# Patient Record
Sex: Male | Born: 1945
Health system: Southern US, Community
[De-identification: ages and names within clinical notes are randomized; demographics above are authoritative.]

---

## 2018-12-27 ENCOUNTER — Ambulatory Visit (INDEPENDENT_AMBULATORY_CARE_PROVIDER_SITE_OTHER): Payer: Medicare Other | Admitting: Podiatry

## 2018-12-27 ENCOUNTER — Other Ambulatory Visit: Payer: Self-pay

## 2018-12-27 ENCOUNTER — Encounter: Payer: Self-pay | Admitting: Podiatry

## 2018-12-27 DIAGNOSIS — L608 Other nail disorders: Secondary | ICD-10-CM | POA: Diagnosis not present

## 2018-12-27 DIAGNOSIS — E1159 Type 2 diabetes mellitus with other circulatory complications: Secondary | ICD-10-CM | POA: Diagnosis not present

## 2018-12-27 DIAGNOSIS — B351 Tinea unguium: Secondary | ICD-10-CM | POA: Diagnosis not present

## 2018-12-27 DIAGNOSIS — M79675 Pain in left toe(s): Secondary | ICD-10-CM | POA: Diagnosis not present

## 2018-12-27 DIAGNOSIS — M79674 Pain in right toe(s): Secondary | ICD-10-CM

## 2018-12-27 NOTE — Progress Notes (Signed)
This patient presents to the office with chief complaint of long ingrown  thick toe nails both big toes  and diabetic feet.  This patient  says there  is  pain and discomfort his big toes.  .  These nails are painful walking and wearing shoes.  Patient has no history of infection or drainage from both feet.  Patient is unable to  self treat his own nails  Patient has moved from [place] where he had his nails done every six weeks.. This patient presents  to the office today for treatment of his ingrown big toe  nails and a foot evaluation due to history of  diabetes.  General Appearance  Alert, conversant and in no acute stress Vascular  Dorsalis pedis are weakly palpable  B/l.  Posterior tibial pulses are absent  B/l.  Capillary return is diminished   bilaterally. Temperature is within normal limits  bilaterally.  Neurologic  Senn-Weinstein monofilament wire test within normal limits  bilaterally. Muscle power within normal limits bilaterally.  Nails Thick disfigured discolored pincer toenails hallux  B/l.  No redness or swelling or drainage noted.  Orthopedic  No limitations of motion of motion feet .  No crepitus or effusions noted.  No bony pathology or digital deformities noted.  Skin  normotropic skin with no porokeratosis noted bilaterally.  No signs of infections or ulcers noted.     Onychomycosis  Pincer nails   Diabetes with vascular  complications  IE  Debride nails x e.  A diabetic foot exam was performed and there is no evidence  neurologic pathology.  Vascular status should have vascular studies.  Discussed this ingrown nail problem and discussed surgical correction.  RTC 10 weeks.   Gardiner Barefoot DPM

## 2019-01-28 DIAGNOSIS — Z6837 Body mass index (BMI) 37.0-37.9, adult: Secondary | ICD-10-CM | POA: Insufficient documentation

## 2019-01-28 DIAGNOSIS — E1169 Type 2 diabetes mellitus with other specified complication: Secondary | ICD-10-CM | POA: Insufficient documentation

## 2019-01-28 DIAGNOSIS — E785 Hyperlipidemia, unspecified: Secondary | ICD-10-CM | POA: Insufficient documentation

## 2019-01-28 DIAGNOSIS — E66812 Obesity, class 2: Secondary | ICD-10-CM | POA: Insufficient documentation

## 2019-01-28 DIAGNOSIS — K219 Gastro-esophageal reflux disease without esophagitis: Secondary | ICD-10-CM | POA: Insufficient documentation

## 2019-01-28 DIAGNOSIS — I10 Essential (primary) hypertension: Secondary | ICD-10-CM | POA: Insufficient documentation

## 2019-03-14 ENCOUNTER — Other Ambulatory Visit: Payer: Self-pay

## 2019-03-14 ENCOUNTER — Ambulatory Visit: Payer: Medicare Other | Admitting: Podiatry

## 2019-03-14 ENCOUNTER — Encounter: Payer: Self-pay | Admitting: Podiatry

## 2019-03-14 ENCOUNTER — Telehealth: Payer: Self-pay

## 2019-03-14 DIAGNOSIS — E1159 Type 2 diabetes mellitus with other circulatory complications: Secondary | ICD-10-CM

## 2019-03-14 DIAGNOSIS — I739 Peripheral vascular disease, unspecified: Secondary | ICD-10-CM

## 2019-03-14 DIAGNOSIS — M79674 Pain in right toe(s): Secondary | ICD-10-CM

## 2019-03-14 DIAGNOSIS — L608 Other nail disorders: Secondary | ICD-10-CM | POA: Diagnosis not present

## 2019-03-14 DIAGNOSIS — M79675 Pain in left toe(s): Secondary | ICD-10-CM

## 2019-03-14 DIAGNOSIS — B351 Tinea unguium: Secondary | ICD-10-CM | POA: Diagnosis not present

## 2019-03-14 NOTE — Telephone Encounter (Signed)
Referral order has been entered in chart and faxed to [facility] 

## 2019-03-14 NOTE — Progress Notes (Signed)
This patient presents the office with continued concerns with his ingrowing toenails both borders both big toes. .  Patient states that the nails have been doing well except there was one that needed him to trim prior to this visit .  He is still concerned about having surgery for the correction of these ingrowing toenails.  He denies any history of drainage or infection in the toes.  He presents to the office today for an evaluation and treatment of the ingrowing toenails both big toes.   Vascular  Dorsalis pedis  are weakly  palpable  B/L. Posterior tibial pulses are absent both feet.  Capillary return  Diminished..  Temperature gradient is  WNL.  Skin turgor  WNL  Sensorium  Senn Weinstein monofilament wire  WNL. Normal tactile sensation.  Nail Exam  Patient has thick disfigured incurvated nail borders  Hallux nails  B/L. Pincer nails  B/L.  Orthopedic  Exam  Muscle tone and muscle strength  WNL.  No limitations of motion feet  B/L.  No crepitus or joint effusion noted.  Foot type is unremarkable and digits show no abnormalities.  Bony prominences are unremarkable.  Skin  No open lesions.  Normal skin texture and turgor.  Pincer hallux nails.  Incurvation hallux nails  B/L.  Debride nails hallux  B/L.  Discussed this condition with this patient.  We will send him for vascular studies for possible surgical consideration in the future.  Iatrogenic lesion was cauterized on the lateral border left hallux.  RTC 9 weeks.   Gardiner Barefoot DPM

## 2019-03-14 NOTE — Telephone Encounter (Signed)
-----   Message from Gardiner Barefoot, DPM sent at 03/14/2019  8:32 AM EST ----- Could you please set him up for vascular studies.  Thanks  gam

## 2019-03-15 ENCOUNTER — Ambulatory Visit (INDEPENDENT_AMBULATORY_CARE_PROVIDER_SITE_OTHER): Payer: Medicare Other

## 2019-03-15 DIAGNOSIS — I739 Peripheral vascular disease, unspecified: Secondary | ICD-10-CM

## 2019-05-16 ENCOUNTER — Encounter: Payer: Self-pay | Admitting: Podiatry

## 2019-05-16 ENCOUNTER — Ambulatory Visit: Payer: Medicare Other | Admitting: Podiatry

## 2019-05-16 ENCOUNTER — Other Ambulatory Visit: Payer: Self-pay

## 2019-05-16 DIAGNOSIS — M79674 Pain in right toe(s): Secondary | ICD-10-CM

## 2019-05-16 DIAGNOSIS — M79675 Pain in left toe(s): Secondary | ICD-10-CM | POA: Diagnosis not present

## 2019-05-16 DIAGNOSIS — B351 Tinea unguium: Secondary | ICD-10-CM

## 2019-05-16 DIAGNOSIS — E1159 Type 2 diabetes mellitus with other circulatory complications: Secondary | ICD-10-CM

## 2019-05-16 DIAGNOSIS — L608 Other nail disorders: Secondary | ICD-10-CM | POA: Diagnosis not present

## 2019-05-16 NOTE — Progress Notes (Signed)
This patient presents the office with continued concerns with his ingrowing toenails both borders both big toes. .  Patient states he has had his arterial studies performed and there was no evidence of arterial disease.Roy Lynch  He is still concerned about having  possible surgery for the correction of these ingrowing toenails.  He denies any history of drainage or infection in the toes.  He presents to the office today for an evaluation and treatment of the ingrowing toenails both big toes.   Vascular  Dorsalis pedis  are weakly  palpable  B/L. Posterior tibial pulses are absent both feet.  Capillary return  Diminished..  Temperature gradient is  WNL.  Skin turgor  WNL  Sensorium  Senn Weinstein monofilament wire  WNL. Normal tactile sensation.  Nail Exam  Patient has thick disfigured incurvated nail borders  Hallux nails  B/L. Pincer nails  B/L.  Orthopedic  Exam  Muscle tone and muscle strength  WNL.  No limitations of motion feet  B/L.  No crepitus or joint effusion noted.  Foot type is unremarkable and digits show no abnormalities.  Bony prominences are unremarkable.  Skin  No open lesions.  Normal skin texture and turgor.  Pincer hallux nails.  Incurvation hallux nails  B/L.  Debride nails hallux  B/L.  Discussed this condition with this patient.   RTC 9 weeks.   Helane Gunther DPM

## 2019-07-18 ENCOUNTER — Ambulatory Visit (INDEPENDENT_AMBULATORY_CARE_PROVIDER_SITE_OTHER): Payer: Medicare Other | Admitting: Podiatry

## 2019-07-18 ENCOUNTER — Encounter: Payer: Self-pay | Admitting: Podiatry

## 2019-07-18 ENCOUNTER — Other Ambulatory Visit: Payer: Self-pay

## 2019-07-18 VITALS — Temp 97.0°F

## 2019-07-18 DIAGNOSIS — E1159 Type 2 diabetes mellitus with other circulatory complications: Secondary | ICD-10-CM | POA: Diagnosis not present

## 2019-07-18 DIAGNOSIS — B351 Tinea unguium: Secondary | ICD-10-CM

## 2019-07-18 DIAGNOSIS — L608 Other nail disorders: Secondary | ICD-10-CM

## 2019-07-18 DIAGNOSIS — M79675 Pain in left toe(s): Secondary | ICD-10-CM | POA: Diagnosis not present

## 2019-07-18 DIAGNOSIS — M79674 Pain in right toe(s): Secondary | ICD-10-CM

## 2019-07-18 NOTE — Progress Notes (Signed)
This patient returns to my office for at risk foot care.  This patient requires this care by a professional since this patient will be at risk due to having diabetes with vascular disease.  This patient is unable to cut nails himself since the patient cannot reach his nails.These nails are painful walking and wearing shoes.  This patient presents for at risk foot care today.  General Appearance  Alert, conversant and in no acute stress.  Vascular  Dorsalis pedis and posterior tibial  pulses are palpable  bilaterally.  Capillary return is within normal limits  bilaterally. Temperature is within normal limits  bilaterally.  Neurologic  Senn-Weinstein monofilament wire test within normal limits  bilaterally. Muscle power within normal limits bilaterally.  Nails Thick disfigured discolored nails with subungual debris  With incurvation hallux nails  B/L.Marland Kitchen No evidence of bacterial infection or drainage bilaterally.  Orthopedic  No limitations of motion  feet .  No crepitus or effusions noted.  No bony pathology or digital deformities noted.  Skin  normotropic skin with no porokeratosis noted bilaterally.  No signs of infections or ulcers noted.     Onychomycosis  Pain in right toes  Pain in left toes  Consent was obtained for treatment procedures.   Mechanical debridement of hallux nails   bilaterally performed with a nail nipper.  Filed with dremel without incident.    Return office visit   9 weeks                   Told patient to return for periodic foot care and evaluation due to potential at risk complications.   Helane Gunther DPM

## 2019-08-31 DIAGNOSIS — D649 Anemia, unspecified: Secondary | ICD-10-CM | POA: Insufficient documentation

## 2019-08-31 NOTE — Progress Notes (Signed)
[place]  Telephone:([phone] Fax:(336) [phone]  ID: Roy Lynch OB: 07-30-1945  MR#: [id number]  BTD#:[id number]  Patient Care Team: Leonel Ramsay, MD as PCP - General (Infectious Diseases) Lloyd Huger, MD as Consulting Physician (Oncology)  CHIEF COMPLAINT: Anemia, unspecified.  INTERVAL HISTORY: Patient is a 74 year old male who was noted to have decreased hemoglobin on routine blood work.  He has mild weakness and fatigue as well as occasional dyspnea on exertion, but otherwise feels well.  He has no neurologic complaints.  He denies any recent fevers or illnesses.  He has good appetite and denies weight loss.  He has no chest pain, cough, or hemoptysis.  He denies any nausea, vomiting, constipation, or diarrhea.  He denies any melena or hematochezia.  He has no urinary complaints.  Patient otherwise feels well and offers no further specific complaints today.  REVIEW OF SYSTEMS:   Review of Systems  Constitutional: Positive for malaise/fatigue. Negative for fever and weight loss.  Respiratory: Negative.  Negative for cough, hemoptysis and shortness of breath.   Cardiovascular: Negative.  Negative for chest pain and leg swelling.  Gastrointestinal: Negative.  Negative for abdominal pain, blood in stool and melena.  Genitourinary: Negative.  Negative for hematuria.  Musculoskeletal: Negative.  Negative for back pain.  Skin: Negative.  Negative for rash.  Neurological: Positive for weakness. Negative for dizziness, focal weakness and headaches.  Psychiatric/Behavioral: Negative.  The patient is not nervous/anxious.     As per HPI. Otherwise, a complete review of systems is negative.  PAST MEDICAL HISTORY: History reviewed. No pertinent past medical history.  PAST SURGICAL HISTORY: History reviewed. No pertinent surgical history.  FAMILY HISTORY: History reviewed. No pertinent family history.  ADVANCED DIRECTIVES (Y/N):  N  HEALTH  MAINTENANCE: Social History   Tobacco Use  . Smoking status: Never Smoker  . Smokeless tobacco: Never Used  Substance Use Topics  . Alcohol use: Never  . Drug use: Never     Colonoscopy:  PAP:  Bone density:  Lipid panel:  No Known Allergies  Current Outpatient Medications  Medication Sig Dispense Refill  . acetaminophen (TYLENOL) 500 MG tablet Take 500 mg by mouth daily.     Marland Kitchen atorvastatin (LIPITOR) 40 MG tablet Take 40 mg by mouth at bedtime.    . carvedilol (COREG) 6.25 MG tablet Take 6.25 mg by mouth daily.    . chlorhexidine (PERIDEX) 0.12 % solution SMARTSIG:0.5 Capful(s) By Mouth Morning-Evening    . ferrous sulfate 325 (65 FE) MG tablet Take 325 mg by mouth daily with breakfast.     . fluticasone (FLONASE) 50 MCG/ACT nasal spray Place 2 sprays into both nostrils daily.    . furosemide (LASIX) 40 MG tablet Take 40 mg by mouth.    . insulin detemir (LEVEMIR) 100 UNIT/ML injection Inject 25 Units into the skin daily.     . insulin lispro (HUMALOG) 100 UNIT/ML injection Inject 6 Units into the skin 3 (three) times daily with meals.     . liraglutide (VICTOZA) 18 MG/3ML SOPN Inject 18 mg into the skin daily.     Marland Kitchen losartan (COZAAR) 100 MG tablet Take 100 mg by mouth daily.     . metFORMIN (GLUCOPHAGE) 1000 MG tablet Take 1,000 mg by mouth 2 (two) times daily with a meal.     . Multiple Vitamins-Minerals (CENTRUM SILVER) tablet Take 1 tablet by mouth daily.     Marland Kitchen NOVOFINE 32G X 6 MM MISC USE TO INJECT VICTOZA ONCE  DAILY    . pantoprazole (PROTONIX) 40 MG tablet Take 40 mg by mouth daily.    . vitamin B-12 (CYANOCOBALAMIN) 1000 MCG tablet Take 1,000 mcg by mouth daily.     No current facility-administered medications for this visit.    OBJECTIVE: Vitals:   09/03/19 1014  BP: (!) 154/57  Pulse: 65  Resp: 20  Temp: (!) 97.2 F (36.2 C)  SpO2: 98%     There is no height or weight on file to calculate BMI.    ECOG FS:0 - Asymptomatic  General: Well-developed,  well-nourished, no acute distress. Eyes: Pink conjunctiva, anicteric sclera. HEENT: Normocephalic, moist mucous membranes. Lungs: No audible wheezing or coughing. Heart: Regular rate and rhythm. Abdomen: Soft, nontender, no obvious distention. Musculoskeletal: No edema, cyanosis, or clubbing. Neuro: Alert, answering all questions appropriately. Cranial nerves grossly intact. Skin: No rashes or petechiae noted. Psych: Normal affect. Lymphatics: No cervical, calvicular, axillary or inguinal LAD.   LAB RESULTS:  No results found for: NA, K, CL, CO2, GLUCOSE, BUN, CREATININE, CALCIUM, PROT, ALBUMIN, AST, ALT, ALKPHOS, BILITOT, GFRNONAA, GFRAA  Lab Results  Component Value Date   WBC 5.7 09/03/2019   HGB 9.9 (L) 09/03/2019   HCT 31.0 (L) 09/03/2019   MCV 89.3 09/03/2019   PLT 231 09/03/2019     STUDIES: No results found.  ASSESSMENT: Anemia, unspecified.  PLAN:    1. Anemia, unspecified: Patient's hemoglobin remains decreased, but relatively stable at 9.9. His reticulocyte count is inappropriately normal. LDH is also normal, likely indicating no underlying hemolysis. The remainder of his laboratory work including iron stores, B12, folate, hemolysis labs, and Intelligen Myeloid panel, are all pending at time of dictation. Patient has been instructed to continue his oral iron supplementation as prescribed. He will have a video assisted telemedicine visit in 3 weeks to discuss the results as well as any treatment intervention is necessary.  I spent a total of 45 minutes reviewing chart data, face-to-face evaluation with the patient, counseling and coordination of care as detailed above.   Patient expressed understanding and was in agreement with this plan. He also understands that He can call clinic at any time with any questions, concerns, or complaints.   Cancer Staging No matching staging information was found for the patient.  Jeralyn Ruths, MD   09/03/2019 11:52  AM

## 2019-09-03 ENCOUNTER — Inpatient Hospital Stay: Payer: Medicare Other | Attending: Oncology | Admitting: Oncology

## 2019-09-03 ENCOUNTER — Other Ambulatory Visit: Payer: Self-pay

## 2019-09-03 ENCOUNTER — Inpatient Hospital Stay: Payer: Medicare Other

## 2019-09-03 ENCOUNTER — Encounter: Payer: Self-pay | Admitting: Oncology

## 2019-09-03 DIAGNOSIS — R531 Weakness: Secondary | ICD-10-CM | POA: Insufficient documentation

## 2019-09-03 DIAGNOSIS — D649 Anemia, unspecified: Secondary | ICD-10-CM | POA: Diagnosis present

## 2019-09-03 DIAGNOSIS — R0609 Other forms of dyspnea: Secondary | ICD-10-CM

## 2019-09-03 DIAGNOSIS — Z79899 Other long term (current) drug therapy: Secondary | ICD-10-CM | POA: Insufficient documentation

## 2019-09-03 DIAGNOSIS — Z794 Long term (current) use of insulin: Secondary | ICD-10-CM

## 2019-09-03 DIAGNOSIS — R5383 Other fatigue: Secondary | ICD-10-CM | POA: Insufficient documentation

## 2019-09-03 LAB — IRON AND TIBC
Iron: 50 ug/dL (ref 45–182)
Saturation Ratios: 17 % — ABNORMAL LOW (ref 17.9–39.5)
TIBC: 287 ug/dL (ref 250–450)
UIBC: 237 ug/dL

## 2019-09-03 LAB — CBC
HCT: 31 % — ABNORMAL LOW (ref 39.0–52.0)
Hemoglobin: 9.9 g/dL — ABNORMAL LOW (ref 13.0–17.0)
MCH: 28.5 pg (ref 26.0–34.0)
MCHC: 31.9 g/dL (ref 30.0–36.0)
MCV: 89.3 fL (ref 80.0–100.0)
Platelets: 231 10*3/uL (ref 150–400)
RBC: 3.47 MIL/uL — ABNORMAL LOW (ref 4.22–5.81)
RDW: 13.1 % (ref 11.5–15.5)
WBC: 5.7 10*3/uL (ref 4.0–10.5)
nRBC: 0 % (ref 0.0–0.2)

## 2019-09-03 LAB — LACTATE DEHYDROGENASE: LDH: 108 U/L (ref 98–192)

## 2019-09-03 LAB — RETICULOCYTES
Immature Retic Fract: 11.2 % (ref 2.3–15.9)
RBC.: 3.46 MIL/uL — ABNORMAL LOW (ref 4.22–5.81)
Retic Count, Absolute: 61.9 10*3/uL (ref 19.0–186.0)
Retic Ct Pct: 1.8 % (ref 0.4–3.1)

## 2019-09-03 LAB — DAT, POLYSPECIFIC AHG (ARMC ONLY): Polyspecific AHG test: NEGATIVE

## 2019-09-03 LAB — VITAMIN B12: Vitamin B-12: 1954 pg/mL — ABNORMAL HIGH (ref 180–914)

## 2019-09-03 LAB — FOLATE: Folate: 30 ng/mL (ref >5.9)

## 2019-09-03 LAB — FERRITIN: Ferritin: 48 ng/mL (ref 24–336)

## 2019-09-04 LAB — PROTEIN ELECTROPHORESIS, SERUM
A/G Ratio: 1.2 (ref 0.7–1.7)
Albumin ELP: 3.4 g/dL (ref 2.9–4.4)
Alpha-1-Globulin: 0.2 g/dL (ref 0.0–0.4)
Alpha-2-Globulin: 0.6 g/dL (ref 0.4–1.0)
Beta Globulin: 1.1 g/dL (ref 0.7–1.3)
Gamma Globulin: 1 g/dL (ref 0.4–1.8)
Globulin, Total: 2.9 g/dL (ref 2.2–3.9)
Total Protein ELP: 6.3 g/dL (ref 6.0–8.5)

## 2019-09-04 LAB — HAPTOGLOBIN: Haptoglobin: 125 mg/dL (ref 34–355)

## 2019-09-04 LAB — ERYTHROPOIETIN: Erythropoietin: 11 m[IU]/mL (ref 2.6–18.5)

## 2019-09-11 LAB — MISC LABCORP TEST (SEND OUT): Labcorp test code: 451953

## 2019-09-19 ENCOUNTER — Encounter: Payer: Self-pay | Admitting: Podiatry

## 2019-09-19 ENCOUNTER — Ambulatory Visit: Payer: Medicare Other | Admitting: Podiatry

## 2019-09-19 ENCOUNTER — Other Ambulatory Visit: Payer: Self-pay

## 2019-09-19 DIAGNOSIS — I739 Peripheral vascular disease, unspecified: Secondary | ICD-10-CM

## 2019-09-19 DIAGNOSIS — E1159 Type 2 diabetes mellitus with other circulatory complications: Secondary | ICD-10-CM | POA: Diagnosis not present

## 2019-09-19 DIAGNOSIS — M79675 Pain in left toe(s): Secondary | ICD-10-CM

## 2019-09-19 DIAGNOSIS — B351 Tinea unguium: Secondary | ICD-10-CM | POA: Diagnosis not present

## 2019-09-19 DIAGNOSIS — M79674 Pain in right toe(s): Secondary | ICD-10-CM

## 2019-09-19 DIAGNOSIS — L608 Other nail disorders: Secondary | ICD-10-CM | POA: Diagnosis not present

## 2019-09-19 NOTE — Progress Notes (Signed)
This patient returns to my office for at risk foot care.  This patient requires this care by a professional since this patient will be at risk due to having diabetes with vascular disease.  This patient is unable to cut nails himself since the patient cannot reach his nails.These nails are painful walking and wearing shoes.  This patient presents for at risk foot care today.  General Appearance  Alert, conversant and in no acute stress.  Vascular  Dorsalis pedis and posterior tibial  pulses are palpable  bilaterally.  Capillary return is within normal limits  bilaterally. Temperature is within normal limits  bilaterally.  Neurologic  Senn-Weinstein monofilament wire test within normal limits  bilaterally. Muscle power within normal limits bilaterally.  Nails Thick disfigured discolored nails with subungual debris  with incurvation hallux nails  B/L.. No evidence of bacterial infection or drainage bilaterally.  Orthopedic  No limitations of motion  feet .  No crepitus or effusions noted.  No bony pathology or digital deformities noted.  Skin  normotropic skin with no porokeratosis noted bilaterally.  No signs of infections or ulcers noted.     Onychomycosis  Pain in right toes  Pain in left toes  Consent was obtained for treatment procedures.   Mechanical debridement of hallux nails   bilaterally performed with a nail nipper.  Filed with dremel without incident.    Return office visit   9 weeks                   Told patient to return for periodic foot care and evaluation due to potential at risk complications.   Admir Candelas DPM  

## 2019-09-24 NOTE — Progress Notes (Signed)
[facility]  Telephone:(336) [phone] Fax:(336) [phone]  ID: Roy Lynch OB: February 01, 1946  MR#: [id number]  WEX#:[id number]  Patient Care Team: Leonel Ramsay, MD as PCP - General (Infectious Diseases) Lloyd Huger, MD as Consulting Physician (Oncology)  I connected with Roy Lynch on 09/29/19 at  2:30 PM EDT by video enabled telemedicine visit and verified that I am speaking with the correct person using two identifiers.   I discussed the limitations, risks, security and privacy concerns of performing an evaluation and management service by telemedicine and the availability of in-person appointments. I also discussed with the patient that there may be a patient responsible charge related to this service. The patient expressed understanding and agreed to proceed.   Other persons participating in the visit and their role in the encounter: Patient, MD.  Patients location: Home. Providers location: Clinic.  CHIEF COMPLAINT: Anemia, unspecified.  INTERVAL HISTORY: Patient agreed to video assisted telemedicine visit for further evaluation and discussion of his laboratory results.  He currently feels well and is asymptomatic.  He has no neurologic complaints.  He denies any recent fevers or illnesses.  He has a good appetite and denies weight loss.  He has no chest pain, cough, or hemoptysis.  He denies any nausea, vomiting, constipation, or diarrhea.  He denies any melena or hematochezia.  He has no urinary complaints.  Patient offers no specific complaints today.  REVIEW OF SYSTEMS:   Review of Systems  Constitutional: Negative.  Negative for fever, malaise/fatigue and weight loss.  Respiratory: Negative.  Negative for cough, hemoptysis and shortness of breath.   Cardiovascular: Negative.  Negative for chest pain and leg swelling.  Gastrointestinal: Negative.  Negative for abdominal pain, blood in stool and melena.  Genitourinary: Negative.  Negative for  hematuria.  Musculoskeletal: Negative.  Negative for back pain.  Skin: Negative.  Negative for rash.  Neurological: Negative.  Negative for dizziness, focal weakness, weakness and headaches.  Psychiatric/Behavioral: Negative.  The patient is not nervous/anxious.     As per HPI. Otherwise, a complete review of systems is negative.  PAST MEDICAL HISTORY: History reviewed. No pertinent past medical history.  PAST SURGICAL HISTORY: History reviewed. No pertinent surgical history.  FAMILY HISTORY: History reviewed. No pertinent family history.  ADVANCED DIRECTIVES (Y/N):  N  HEALTH MAINTENANCE: Social History   Tobacco Use   Smoking status: Never Smoker   Smokeless tobacco: Never Used  Vaping Use   Vaping Use: Never used  Substance Use Topics   Alcohol use: Never   Drug use: Never     Colonoscopy:  PAP:  Bone density:  Lipid panel:  No Known Allergies  Current Outpatient Medications  Medication Sig Dispense Refill   acetaminophen (TYLENOL) 500 MG tablet Take 500 mg by mouth daily.      atorvastatin (LIPITOR) 20 MG tablet Take 20 mg by mouth daily.     carvedilol (COREG) 6.25 MG tablet Take 6.25 mg by mouth daily.     chlorhexidine (PERIDEX) 0.12 % solution SMARTSIG:0.5 Capful(s) By Mouth Morning-Evening     ferrous sulfate 325 (65 FE) MG tablet Take 325 mg by mouth daily with breakfast.      fluticasone (FLONASE) 50 MCG/ACT nasal spray Place 2 sprays into both nostrils daily.     furosemide (LASIX) 40 MG tablet Take 40 mg by mouth.     insulin detemir (LEVEMIR) 100 UNIT/ML injection Inject 25 Units into the skin daily.      insulin lispro (HUMALOG) 100 UNIT/ML  injection Inject 6 Units into the skin 3 (three) times daily with meals.      liraglutide (VICTOZA) 18 MG/3ML SOPN Inject 18 mg into the skin daily.      losartan (COZAAR) 100 MG tablet Take 100 mg by mouth daily.      metFORMIN (GLUCOPHAGE) 1000 MG tablet Take 1,000 mg by mouth 2 (two) times  daily with a meal.      Multiple Vitamins-Minerals (CENTRUM SILVER) tablet Take 1 tablet by mouth daily.      NOVOFINE 32G X 6 MM MISC USE TO INJECT VICTOZA ONCE DAILY     pantoprazole (PROTONIX) 40 MG tablet Take 40 mg by mouth daily.     vitamin B-12 (CYANOCOBALAMIN) 1000 MCG tablet Take 1,000 mcg by mouth daily.     No current facility-administered medications for this visit.    OBJECTIVE: There were no vitals filed for this visit.   There is no height or weight on file to calculate BMI.    ECOG FS:0 - Asymptomatic  General: Well-developed, well-nourished, no acute distress. HEENT: Normocephalic. Neuro: Alert, answering all questions appropriately. Cranial nerves grossly intact. Psych: Normal affect.   LAB RESULTS:  No results found for: NA, K, CL, CO2, GLUCOSE, BUN, CREATININE, CALCIUM, PROT, ALBUMIN, AST, ALT, ALKPHOS, BILITOT, GFRNONAA, GFRAA  Lab Results  Component Value Date   WBC 5.7 09/03/2019   HGB 9.9 (L) 09/03/2019   HCT 31.0 (L) 09/03/2019   MCV 89.3 09/03/2019   PLT 231 09/03/2019     STUDIES: No results found.  ASSESSMENT: Anemia, unspecified.  PLAN:    1. Anemia, unspecified: Patient's hemoglobin remains decreased, but relatively stable at 9.9.  He has a mildly decreased saturation ratio of 17%, but the remainder of his laboratory work is either negative or within normal limits including SPEP and Intelligen Myeloid panel. Patient has been instructed to continue his oral iron supplementation as prescribed.  No intervention is needed at this time.  Patient does not require bone marrow biopsy, but would consider 1 in the future if his hemoglobin trends down.  Return to clinic in 6 months with repeat laboratory work and video assisted telemedicine visit.  I provided 20 minutes of face-to-face video visit time during this encounter which included chart review, counseling, and coordination of care as documented above.   Patient expressed understanding and  was in agreement with this plan. He also understands that He can call clinic at any time with any questions, concerns, or complaints.    Lloyd Huger, MD   09/29/2019 8:46 AM

## 2019-09-27 ENCOUNTER — Inpatient Hospital Stay: Payer: Medicare Other | Attending: Oncology | Admitting: Oncology

## 2019-09-27 ENCOUNTER — Encounter: Payer: Self-pay | Admitting: Oncology

## 2019-09-27 DIAGNOSIS — D649 Anemia, unspecified: Secondary | ICD-10-CM | POA: Diagnosis not present

## 2019-09-27 NOTE — Progress Notes (Signed)
Patient prescreened for appointment. Patient reports that he hasn't been having a lot of energy lately

## 2019-11-21 ENCOUNTER — Other Ambulatory Visit: Payer: Self-pay

## 2019-11-21 ENCOUNTER — Ambulatory Visit: Payer: Medicare Other | Admitting: Podiatry

## 2019-11-21 ENCOUNTER — Encounter: Payer: Self-pay | Admitting: Podiatry

## 2019-11-21 DIAGNOSIS — L608 Other nail disorders: Secondary | ICD-10-CM

## 2019-11-21 DIAGNOSIS — E1159 Type 2 diabetes mellitus with other circulatory complications: Secondary | ICD-10-CM | POA: Diagnosis not present

## 2019-11-21 DIAGNOSIS — B351 Tinea unguium: Secondary | ICD-10-CM

## 2019-11-21 DIAGNOSIS — M79675 Pain in left toe(s): Secondary | ICD-10-CM

## 2019-11-21 DIAGNOSIS — M79674 Pain in right toe(s): Secondary | ICD-10-CM

## 2019-11-21 NOTE — Progress Notes (Signed)
This patient returns to my office for at risk foot care.  This patient requires this care by a professional since this patient will be at risk due to having diabetes with vascular disease.  This patient is unable to cut nails himself since the patient cannot reach his nails.These nails are painful walking and wearing shoes.  This patient presents for at risk foot care today.  General Appearance  Alert, conversant and in no acute stress.  Vascular  Dorsalis pedis and posterior tibial  pulses are palpable  bilaterally.  Capillary return is within normal limits  bilaterally. Temperature is within normal limits  bilaterally.  Neurologic  Senn-Weinstein monofilament wire test within normal limits  bilaterally. Muscle power within normal limits bilaterally.  Nails Thick disfigured discolored nails with subungual debris  with incurvation hallux nails  B/L.. No evidence of bacterial infection or drainage bilaterally.  Orthopedic  No limitations of motion  feet .  No crepitus or effusions noted.  No bony pathology or digital deformities noted.  Skin  normotropic skin with no porokeratosis noted bilaterally.  No signs of infections or ulcers noted.     Onychomycosis  Pain in right toes  Pain in left toes  Consent was obtained for treatment procedures.   Mechanical debridement of hallux nails   bilaterally performed with a nail nipper.  Filed with dremel without incident.    Return office visit   9 weeks                   Told patient to return for periodic foot care and evaluation due to potential at risk complications.   Olden Klauer DPM  

## 2020-01-27 ENCOUNTER — Ambulatory Visit: Payer: Medicare Other | Admitting: Podiatry

## 2020-01-27 ENCOUNTER — Encounter: Payer: Self-pay | Admitting: Podiatry

## 2020-01-27 ENCOUNTER — Other Ambulatory Visit: Payer: Self-pay

## 2020-01-27 DIAGNOSIS — E1159 Type 2 diabetes mellitus with other circulatory complications: Secondary | ICD-10-CM

## 2020-01-27 DIAGNOSIS — B351 Tinea unguium: Secondary | ICD-10-CM

## 2020-01-27 DIAGNOSIS — I739 Peripheral vascular disease, unspecified: Secondary | ICD-10-CM

## 2020-01-27 DIAGNOSIS — M79675 Pain in left toe(s): Secondary | ICD-10-CM

## 2020-01-27 DIAGNOSIS — M79674 Pain in right toe(s): Secondary | ICD-10-CM

## 2020-01-27 DIAGNOSIS — L608 Other nail disorders: Secondary | ICD-10-CM

## 2020-01-27 NOTE — Progress Notes (Signed)
This patient returns to my office for at risk foot care.  This patient requires this care by a professional since this patient will be at risk due to having diabetes with vascular disease.  This patient is unable to cut nails himself since the patient cannot reach his nails.These nails are painful walking and wearing shoes.  This patient presents for at risk foot care today.  General Appearance  Alert, conversant and in no acute stress.  Vascular  Dorsalis pedis and posterior tibial  pulses are palpable  bilaterally.  Capillary return is within normal limits  bilaterally. Temperature is within normal limits  bilaterally.  Neurologic  Senn-Weinstein monofilament wire test within normal limits  bilaterally. Muscle power within normal limits bilaterally.  Nails Thick disfigured discolored nails with subungual debris  with incurvation hallux nails  B/L.. No evidence of bacterial infection or drainage bilaterally.  Orthopedic  No limitations of motion  feet .  No crepitus or effusions noted.  No bony pathology or digital deformities noted.  Skin  normotropic skin with no porokeratosis noted bilaterally.  No signs of infections or ulcers noted.     Onychomycosis  Pain in right toes  Pain in left toes  Consent was obtained for treatment procedures.   Mechanical debridement of hallux nails   bilaterally performed with a nail nipper.  Filed with dremel without incident.    Return office visit   9 weeks                   Told patient to return for periodic foot care and evaluation due to potential at risk complications.   Gery Sabedra DPM  

## 2020-03-30 ENCOUNTER — Encounter: Payer: Self-pay | Admitting: Podiatry

## 2020-03-30 ENCOUNTER — Other Ambulatory Visit: Payer: Self-pay

## 2020-03-30 ENCOUNTER — Inpatient Hospital Stay: Payer: Medicare Other | Attending: Oncology

## 2020-03-30 ENCOUNTER — Other Ambulatory Visit: Payer: Self-pay | Admitting: Oncology

## 2020-03-30 ENCOUNTER — Ambulatory Visit: Payer: Medicare Other | Admitting: Podiatry

## 2020-03-30 DIAGNOSIS — D649 Anemia, unspecified: Secondary | ICD-10-CM | POA: Diagnosis not present

## 2020-03-30 DIAGNOSIS — B351 Tinea unguium: Secondary | ICD-10-CM | POA: Diagnosis not present

## 2020-03-30 DIAGNOSIS — L608 Other nail disorders: Secondary | ICD-10-CM | POA: Diagnosis not present

## 2020-03-30 DIAGNOSIS — I739 Peripheral vascular disease, unspecified: Secondary | ICD-10-CM | POA: Diagnosis not present

## 2020-03-30 DIAGNOSIS — M79675 Pain in left toe(s): Secondary | ICD-10-CM

## 2020-03-30 DIAGNOSIS — E1159 Type 2 diabetes mellitus with other circulatory complications: Secondary | ICD-10-CM | POA: Diagnosis not present

## 2020-03-30 DIAGNOSIS — M79674 Pain in right toe(s): Secondary | ICD-10-CM

## 2020-03-30 LAB — COMPREHENSIVE METABOLIC PANEL
ALT: 14 U/L (ref 0–44)
AST: 15 U/L (ref 15–41)
Albumin: 3.9 g/dL (ref 3.5–5.0)
Alkaline Phosphatase: 58 U/L (ref 38–126)
Anion gap: 12 (ref 5–15)
BUN: 32 mg/dL — ABNORMAL HIGH (ref 8–23)
CO2: 24 mmol/L (ref 22–32)
Calcium: 9.7 mg/dL (ref 8.9–10.3)
Chloride: 104 mmol/L (ref 98–111)
Creatinine, Ser: 1.51 mg/dL — ABNORMAL HIGH (ref 0.61–1.24)
GFR, Estimated: 48 mL/min — ABNORMAL LOW (ref >60)
Glucose, Bld: 81 mg/dL (ref 70–99)
Potassium: 4.4 mmol/L (ref 3.5–5.1)
Sodium: 140 mmol/L (ref 135–145)
Total Bilirubin: 0.8 mg/dL (ref 0.3–1.2)
Total Protein: 7.5 g/dL (ref 6.5–8.1)

## 2020-03-30 LAB — CBC WITH DIFFERENTIAL/PLATELET
Abs Immature Granulocytes: 0.02 10*3/uL (ref 0.00–0.07)
Basophils Absolute: 0.1 10*3/uL (ref 0.0–0.1)
Basophils Relative: 1 %
Eosinophils Absolute: 0.2 10*3/uL (ref 0.0–0.5)
Eosinophils Relative: 3 %
HCT: 31.7 % — ABNORMAL LOW (ref 39.0–52.0)
Hemoglobin: 10.3 g/dL — ABNORMAL LOW (ref 13.0–17.0)
Immature Granulocytes: 0 %
Lymphocytes Relative: 43 %
Lymphs Abs: 3.3 10*3/uL (ref 0.7–4.0)
MCH: 29.3 pg (ref 26.0–34.0)
MCHC: 32.5 g/dL (ref 30.0–36.0)
MCV: 90.1 fL (ref 80.0–100.0)
Monocytes Absolute: 0.7 10*3/uL (ref 0.1–1.0)
Monocytes Relative: 9 %
Neutro Abs: 3.4 10*3/uL (ref 1.7–7.7)
Neutrophils Relative %: 44 %
Platelets: 267 10*3/uL (ref 150–400)
RBC: 3.52 MIL/uL — ABNORMAL LOW (ref 4.22–5.81)
RDW: 12.8 % (ref 11.5–15.5)
WBC: 7.6 10*3/uL (ref 4.0–10.5)
nRBC: 0 % (ref 0.0–0.2)

## 2020-03-30 LAB — FERRITIN: Ferritin: 63 ng/mL (ref 24–336)

## 2020-03-30 NOTE — Progress Notes (Signed)
This patient returns to my office for at risk foot care.  This patient requires this care by a professional since this patient will be at risk due to having diabetes with vascular disease.  This patient is unable to cut nails himself since the patient cannot reach his nails.These nails are painful walking and wearing shoes.  This patient presents for at risk foot care today.  General Appearance  Alert, conversant and in no acute stress.  Vascular  Dorsalis pedis and posterior tibial  pulses are palpable  bilaterally.  Capillary return is within normal limits  bilaterally. Temperature is within normal limits  bilaterally.  Neurologic  Senn-Weinstein monofilament wire test within normal limits  bilaterally. Muscle power within normal limits bilaterally.  Nails Thick disfigured discolored nails with subungual debris  with incurvation hallux nails  B/L.. No evidence of bacterial infection or drainage bilaterally.  Orthopedic  No limitations of motion  feet .  No crepitus or effusions noted.  No bony pathology or digital deformities noted.  Skin  normotropic skin with no porokeratosis noted bilaterally.  No signs of infections or ulcers noted.     Onychomycosis  Pain in right toes  Pain in left toes  Consent was obtained for treatment procedures.   Mechanical debridement of hallux nails   bilaterally performed with a nail nipper.  Filed with dremel without incident.    Return office visit   9 weeks                   Told patient to return for periodic foot care and evaluation due to potential at risk complications.   Tiasia Weberg DPM  

## 2020-04-05 NOTE — Progress Notes (Signed)
  [facility]  Telephone:(336) [phone] Fax:(336) [phone]  ID: Roy Lynch OB: [birth year]  MR#: [id number]  IRS#:[id number]  Patient Care Team: Mick Sell, MD as PCP - General (Infectious Diseases) Jeralyn Ruths, MD as Consulting Physician (Oncology)    Jeralyn Ruths, MD   04/07/2020 8:44 AM     This encounter was created in error - please disregard.

## 2020-04-06 ENCOUNTER — Inpatient Hospital Stay: Payer: Medicare Other | Admitting: Oncology

## 2020-04-06 ENCOUNTER — Encounter: Payer: Self-pay | Admitting: Oncology

## 2020-04-06 NOTE — Progress Notes (Signed)
Patient denies any concerns today.  

## 2020-06-01 ENCOUNTER — Encounter: Payer: Self-pay | Admitting: Podiatry

## 2020-06-01 ENCOUNTER — Other Ambulatory Visit: Payer: Self-pay

## 2020-06-01 ENCOUNTER — Ambulatory Visit: Payer: Medicare Other | Admitting: Podiatry

## 2020-06-01 DIAGNOSIS — L608 Other nail disorders: Secondary | ICD-10-CM | POA: Diagnosis not present

## 2020-06-01 DIAGNOSIS — E1159 Type 2 diabetes mellitus with other circulatory complications: Secondary | ICD-10-CM

## 2020-06-01 DIAGNOSIS — B351 Tinea unguium: Secondary | ICD-10-CM | POA: Diagnosis not present

## 2020-06-01 DIAGNOSIS — M79674 Pain in right toe(s): Secondary | ICD-10-CM

## 2020-06-01 DIAGNOSIS — M79675 Pain in left toe(s): Secondary | ICD-10-CM

## 2020-06-01 NOTE — Progress Notes (Signed)
This patient returns to my office for at risk foot care.  This patient requires this care by a professional since this patient will be at risk due to having diabetes with vascular disease.  This patient is unable to cut nails himself since the patient cannot reach his nails.These nails are painful walking and wearing shoes.  This patient presents for at risk foot care today.  General Appearance  Alert, conversant and in no acute stress.  Vascular  Dorsalis pedis and posterior tibial  pulses are palpable  bilaterally.  Capillary return is within normal limits  bilaterally. Temperature is within normal limits  bilaterally.  Neurologic  Senn-Weinstein monofilament wire test within normal limits  bilaterally. Muscle power within normal limits bilaterally.  Nails Thick disfigured discolored nails with subungual debris  with incurvation hallux nails  B/L.Marland Kitchen No evidence of bacterial infection or drainage bilaterally.  Orthopedic  No limitations of motion  feet .  No crepitus or effusions noted.  No bony pathology or digital deformities noted.  Skin  normotropic skin with no porokeratosis noted bilaterally.  No signs of infections or ulcers noted.     Onychomycosis  Pain in right toes  Pain in left toes  Consent was obtained for treatment procedures.   Mechanical debridement of hallux nails   bilaterally performed with a nail nipper.  Filed with dremel without incident.    Return office visit   9 weeks                   Told patient to return for periodic foot care and evaluation due to potential at risk complications.   Helane Gunther DPM

## 2020-08-03 ENCOUNTER — Ambulatory Visit: Payer: Medicare Other | Admitting: Podiatry

## 2020-08-03 ENCOUNTER — Encounter: Payer: Self-pay | Admitting: Podiatry

## 2020-08-03 ENCOUNTER — Other Ambulatory Visit: Payer: Self-pay

## 2020-08-03 DIAGNOSIS — M79674 Pain in right toe(s): Secondary | ICD-10-CM

## 2020-08-03 DIAGNOSIS — L608 Other nail disorders: Secondary | ICD-10-CM

## 2020-08-03 DIAGNOSIS — B351 Tinea unguium: Secondary | ICD-10-CM | POA: Diagnosis not present

## 2020-08-03 DIAGNOSIS — M79675 Pain in left toe(s): Secondary | ICD-10-CM

## 2020-08-03 DIAGNOSIS — E1159 Type 2 diabetes mellitus with other circulatory complications: Secondary | ICD-10-CM

## 2020-08-03 NOTE — Progress Notes (Signed)
This patient returns to my office for at risk foot care.  This patient requires this care by a professional since this patient will be at risk due to having diabetes with vascular disease.  This patient is unable to cut nails himself since the patient cannot reach his nails.These nails are painful walking and wearing shoes.  This patient presents for at risk foot care today.  General Appearance  Alert, conversant and in no acute stress.  Vascular  Dorsalis pedis and posterior tibial  pulses are palpable  bilaterally.  Capillary return is within normal limits  bilaterally. Temperature is within normal limits  bilaterally.  Neurologic  Senn-Weinstein monofilament wire test within normal limits  bilaterally. Muscle power within normal limits bilaterally.  Nails Thick disfigured discolored nails with subungual debris  with incurvation hallux nails  B/L.Marland Kitchen No evidence of bacterial infection or drainage bilaterally.  Orthopedic  No limitations of motion  feet .  No crepitus or effusions noted.  No bony pathology or digital deformities noted.  Skin  normotropic skin with no porokeratosis noted bilaterally.  No signs of infections or ulcers noted.     Onychomycosis  Pain in right toes  Pain in left toes  Consent was obtained for treatment procedures.   Mechanical debridement of hallux nails   bilaterally performed with a nail nipper.  Filed with dremel without incident.    Return office visit   9 weeks                   Told patient to return for periodic foot care and evaluation due to potential at risk complications.   Helane Gunther DPM

## 2020-09-17 ENCOUNTER — Other Ambulatory Visit: Payer: Self-pay | Admitting: Nephrology

## 2020-09-17 DIAGNOSIS — N1831 Chronic kidney disease, stage 3a: Secondary | ICD-10-CM

## 2020-09-17 DIAGNOSIS — E1122 Type 2 diabetes mellitus with diabetic chronic kidney disease: Secondary | ICD-10-CM

## 2020-10-19 ENCOUNTER — Ambulatory Visit
Admission: RE | Admit: 2020-10-19 | Discharge: 2020-10-19 | Disposition: A | Discharge status: Home / Self Care | Payer: Medicare Other | Source: Ambulatory Visit | Attending: Nephrology | Admitting: Nephrology

## 2020-10-19 ENCOUNTER — Other Ambulatory Visit: Payer: Self-pay

## 2020-10-19 DIAGNOSIS — E1122 Type 2 diabetes mellitus with diabetic chronic kidney disease: Secondary | ICD-10-CM | POA: Diagnosis not present

## 2020-10-19 DIAGNOSIS — N1831 Chronic kidney disease, stage 3a: Secondary | ICD-10-CM | POA: Insufficient documentation

## 2020-11-05 ENCOUNTER — Encounter: Payer: Self-pay | Admitting: Podiatry

## 2020-11-05 ENCOUNTER — Other Ambulatory Visit: Payer: Self-pay

## 2020-11-05 ENCOUNTER — Ambulatory Visit: Payer: Medicare Other | Admitting: Podiatry

## 2020-11-05 DIAGNOSIS — L608 Other nail disorders: Secondary | ICD-10-CM | POA: Diagnosis not present

## 2020-11-05 DIAGNOSIS — E1159 Type 2 diabetes mellitus with other circulatory complications: Secondary | ICD-10-CM

## 2020-11-05 DIAGNOSIS — M79674 Pain in right toe(s): Secondary | ICD-10-CM

## 2020-11-05 DIAGNOSIS — B351 Tinea unguium: Secondary | ICD-10-CM

## 2020-11-05 DIAGNOSIS — I739 Peripheral vascular disease, unspecified: Secondary | ICD-10-CM | POA: Diagnosis not present

## 2020-11-05 DIAGNOSIS — M79675 Pain in left toe(s): Secondary | ICD-10-CM

## 2020-11-05 NOTE — Progress Notes (Signed)
This patient returns to my office for at risk foot care.  This patient requires this care by a professional since this patient will be at risk due to having diabetes with vascular disease.  This patient is unable to cut nails himself since the patient cannot reach his nails.These nails are painful walking and wearing shoes.  This patient presents for at risk foot care today.  General Appearance  Alert, conversant and in no acute stress.  Vascular  Dorsalis pedis and posterior tibial  pulses are palpable  bilaterally.  Capillary return is within normal limits  bilaterally. Temperature is within normal limits  bilaterally.  Neurologic  Senn-Weinstein monofilament wire test within normal limits  bilaterally. Muscle power within normal limits bilaterally.  Nails Thick disfigured discolored nails with subungual debris  with incurvation hallux nails  B/L.Marland Kitchen No evidence of bacterial infection or drainage bilaterally.  Orthopedic  No limitations of motion  feet .  No crepitus or effusions noted.  No bony pathology or digital deformities noted.  Skin  normotropic skin with no porokeratosis noted bilaterally.  No signs of infections or ulcers noted.     Onychomycosis  Pain in right toes  Pain in left toes  Consent was obtained for treatment procedures.   Mechanical debridement of hallux nails   bilaterally performed with a nail nipper.  Filed with dremel without incident.    Return office visit   9 weeks                   Told patient to return for periodic foot care and evaluation due to potential at risk complications.   Helane Gunther DPM

## 2021-01-07 ENCOUNTER — Ambulatory Visit: Payer: Medicare Other | Admitting: Podiatry

## 2021-01-19 DIAGNOSIS — E785 Hyperlipidemia, unspecified: Secondary | ICD-10-CM | POA: Diagnosis not present

## 2021-01-19 DIAGNOSIS — N182 Chronic kidney disease, stage 2 (mild): Secondary | ICD-10-CM | POA: Diagnosis not present

## 2021-01-19 DIAGNOSIS — D649 Anemia, unspecified: Secondary | ICD-10-CM | POA: Diagnosis not present

## 2021-01-19 DIAGNOSIS — Z794 Long term (current) use of insulin: Secondary | ICD-10-CM | POA: Diagnosis not present

## 2021-01-19 DIAGNOSIS — E1169 Type 2 diabetes mellitus with other specified complication: Secondary | ICD-10-CM | POA: Diagnosis not present

## 2021-01-19 DIAGNOSIS — I10 Essential (primary) hypertension: Secondary | ICD-10-CM | POA: Diagnosis not present

## 2021-01-19 DIAGNOSIS — Z6837 Body mass index (BMI) 37.0-37.9, adult: Secondary | ICD-10-CM | POA: Diagnosis not present

## 2021-01-26 DIAGNOSIS — Z794 Long term (current) use of insulin: Secondary | ICD-10-CM | POA: Diagnosis not present

## 2021-01-26 DIAGNOSIS — I129 Hypertensive chronic kidney disease with stage 1 through stage 4 chronic kidney disease, or unspecified chronic kidney disease: Secondary | ICD-10-CM | POA: Diagnosis not present

## 2021-01-26 DIAGNOSIS — N189 Chronic kidney disease, unspecified: Secondary | ICD-10-CM | POA: Diagnosis not present

## 2021-01-26 DIAGNOSIS — D649 Anemia, unspecified: Secondary | ICD-10-CM | POA: Diagnosis not present

## 2021-01-26 DIAGNOSIS — E785 Hyperlipidemia, unspecified: Secondary | ICD-10-CM | POA: Diagnosis not present

## 2021-01-26 DIAGNOSIS — Z6837 Body mass index (BMI) 37.0-37.9, adult: Secondary | ICD-10-CM | POA: Diagnosis not present

## 2021-01-26 DIAGNOSIS — E1122 Type 2 diabetes mellitus with diabetic chronic kidney disease: Secondary | ICD-10-CM | POA: Diagnosis not present

## 2021-01-28 ENCOUNTER — Encounter: Payer: Self-pay | Admitting: Podiatry

## 2021-01-28 ENCOUNTER — Ambulatory Visit: Payer: Medicare HMO | Admitting: Podiatry

## 2021-01-28 ENCOUNTER — Other Ambulatory Visit: Payer: Self-pay

## 2021-01-28 DIAGNOSIS — M79674 Pain in right toe(s): Secondary | ICD-10-CM | POA: Diagnosis not present

## 2021-01-28 DIAGNOSIS — B351 Tinea unguium: Secondary | ICD-10-CM

## 2021-01-28 DIAGNOSIS — M79675 Pain in left toe(s): Secondary | ICD-10-CM

## 2021-01-28 DIAGNOSIS — I739 Peripheral vascular disease, unspecified: Secondary | ICD-10-CM

## 2021-01-28 DIAGNOSIS — E1159 Type 2 diabetes mellitus with other circulatory complications: Secondary | ICD-10-CM | POA: Diagnosis not present

## 2021-01-28 DIAGNOSIS — L608 Other nail disorders: Secondary | ICD-10-CM

## 2021-01-28 NOTE — Progress Notes (Signed)
This patient returns to my office for at risk foot care.  This patient requires this care by a professional since this patient will be at risk due to having diabetes with vascular disease.  This patient is unable to cut nails himself since the patient cannot reach his nails.These nails are painful walking and wearing shoes.  This patient presents for at risk foot care today.  General Appearance  Alert, conversant and in no acute stress.  Vascular  Dorsalis pedis and posterior tibial  pulses are palpable  bilaterally.  Capillary return is within normal limits  bilaterally. Temperature is within normal limits  bilaterally.  Neurologic  Senn-Weinstein monofilament wire test within normal limits  bilaterally. Muscle power within normal limits bilaterally.  Nails Thick disfigured discolored nails with subungual debris  with incurvation hallux nails  B/L.. No evidence of bacterial infection or drainage bilaterally.  Orthopedic  No limitations of motion  feet .  No crepitus or effusions noted.  No bony pathology or digital deformities noted.  Skin  normotropic skin with no porokeratosis noted bilaterally.  No signs of infections or ulcers noted.     Onychomycosis  Pain in right toes  Pain in left toes  Consent was obtained for treatment procedures.   Mechanical debridement of hallux nails   bilaterally performed with a nail nipper.  Filed with dremel without incident.    Return office visit   9 weeks                   Told patient to return for periodic foot care and evaluation due to potential at risk complications.   Naasia Weilbacher DPM  

## 2021-02-17 ENCOUNTER — Other Ambulatory Visit: Payer: Self-pay

## 2021-02-17 ENCOUNTER — Ambulatory Visit (INDEPENDENT_AMBULATORY_CARE_PROVIDER_SITE_OTHER): Payer: Medicare HMO | Admitting: Urology

## 2021-02-17 ENCOUNTER — Encounter: Payer: Self-pay | Admitting: Urology

## 2021-02-17 VITALS — BP 149/78 | HR 87 | Ht 73.0 in | Wt 280.0 lb

## 2021-02-17 DIAGNOSIS — N5201 Erectile dysfunction due to arterial insufficiency: Secondary | ICD-10-CM | POA: Diagnosis not present

## 2021-02-17 MED ORDER — SILDENAFIL CITRATE 20 MG PO TABS: ORAL_TABLET | ORAL | 1 refills | Status: DC

## 2021-02-17 NOTE — Progress Notes (Signed)
02/17/2021 2:00 PM   Roy Lynch 1945/12/18 [id number]  Referring provider: Leonel Ramsay, MD [place],  [place] [address]  Chief Complaint  Patient presents with   Other    HPI: Roy Lynch is a 75 y.o. male who requested evaluation for discoloration of the penis.  Uncircumcised and over the past year noted depigmented areas on the glans penis.  No pain or discomfort.  No difficulty retracting foreskin though he does feel his penile length has decreased over the years No history of balanitis No bothersome LUTS Denies dysuria, gross hematuria Several year history of difficulty achieving and maintaining an erection with organic risk factors of diabetes, hypertension, hypercholesterolemia, peripheral vascular disease and antihypertensive medications.  No prior tobacco history.  Use sildenafil several years ago which he states was effective   PMH:  Arthritis   Diabetes mellitus without complication (CMS-HCC)   GERD (gastroesophageal reflux disease)   Hyperlipidemia   Hypertension   Surgical History:  ARTHROSCOPY SHOULDER Bilateral   REPAIR EPIGASTRIC HERNIA   REPAIR EXTENSOR TENDON LEG Right   Home Medications:  Allergies as of 02/17/2021   No Known Allergies      Medication List        Accurate as of February 17, 2021  2:00 PM. If you have any questions, ask your nurse or doctor.          Accu-Chek Guide Me w/Device Kit by XX route as directed E11.69   Accu-Chek Guide test strip Generic drug: glucose blood   acetaminophen 500 MG tablet Commonly known as: TYLENOL Take 500 mg by mouth daily.   atorvastatin 40 MG tablet Commonly known as: LIPITOR Take 40 mg by mouth at bedtime.   carvedilol 6.25 MG tablet Commonly known as: COREG Take 6.25 mg by mouth daily.   Centrum Silver tablet Take 1 tablet by mouth daily.   chlorhexidine 0.12 % solution Commonly known as: PERIDEX SMARTSIG:0.5 Capful(s) By Mouth Morning-Evening    ferrous sulfate 325 (65 FE) MG tablet Take 325 mg by mouth daily with breakfast.   fluticasone 50 MCG/ACT nasal spray Commonly known as: FLONASE Place 2 sprays into both nostrils daily.   furosemide 40 MG tablet Commonly known as: LASIX Take by mouth.   insulin detemir 100 UNIT/ML FlexPen Commonly known as: LEVEMIR Inject into the skin.   insulin lispro 100 UNIT/ML injection Commonly known as: HUMALOG Inject 6 Units into the skin 3 (three) times daily with meals.   losartan 100 MG tablet Commonly known as: COZAAR Take 1 tablet by mouth daily.   metFORMIN 1000 MG tablet Commonly known as: GLUCOPHAGE Take 1,000 mg by mouth 2 (two) times daily with a meal.   NovoFine 32G X 6 MM Misc Generic drug: Insulin Pen Needle USE TO INJECT VICTOZA ONCE DAILY   pantoprazole 40 MG tablet Commonly known as: PROTONIX Take 1 tablet by mouth daily.   Victoza 18 MG/3ML Sopn Generic drug: liraglutide Inject 18 mg into the skin daily.   vitamin B-12 1000 MCG tablet Commonly known as: CYANOCOBALAMIN Take 1,000 mcg by mouth daily.        Allergies: No Known Allergies  Family History: No family history on file.  Social History:  reports that he has never smoked. He has never used smokeless tobacco. He reports that he does not drink alcohol and does not use drugs.   Physical Exam: BP (!) 149/78   Pulse 87   Ht _0  (1.854 m)   Wt 280  lb (127 kg)   BMI 36.94 kg/m   Constitutional:  Alert and oriented, No acute distress. HEENT: [place] AT, moist mucus membranes.  Trachea midline, no masses. Cardiovascular: No clubbing, cyanosis, or edema. Respiratory: Normal respiratory effort, no increased work of breathing. GI: Abdomen is soft, nontender, nondistended, no abdominal masses GU: GU phallus uncircumcised.  No phimosis.  Glans without ulceration or lesions.  There is areas of increased pigmentation distally that have the appearance of normal skin pigmentation Skin: No rashes,  bruises or suspicious lesions. Neurologic: Grossly intact, no focal deficits, moving all 4 extremities. Psychiatric: Normal mood and affect.   Assessment & Plan:    1.  Normal exam external genitalia Reassured that changes in skin pigmentation of the uncircumcised penis is not pathologic  2.  Erectile dysfunction He was interested in restarting sildenafil and Rx sent to pharmacy Follow-up prn   Abbie Sons, MD  [facility] [address] [place], [place] [address] [phone]

## 2021-02-18 ENCOUNTER — Encounter: Payer: Self-pay | Admitting: Urology

## 2021-02-25 DIAGNOSIS — N1831 Chronic kidney disease, stage 3a: Secondary | ICD-10-CM | POA: Diagnosis not present

## 2021-02-25 DIAGNOSIS — I10 Essential (primary) hypertension: Secondary | ICD-10-CM | POA: Diagnosis not present

## 2021-02-25 DIAGNOSIS — D631 Anemia in chronic kidney disease: Secondary | ICD-10-CM | POA: Diagnosis not present

## 2021-02-25 DIAGNOSIS — E1122 Type 2 diabetes mellitus with diabetic chronic kidney disease: Secondary | ICD-10-CM | POA: Diagnosis not present

## 2021-03-19 DIAGNOSIS — R69 Illness, unspecified: Secondary | ICD-10-CM | POA: Diagnosis not present

## 2021-04-01 ENCOUNTER — Ambulatory Visit: Payer: Medicare HMO | Admitting: Podiatry

## 2021-04-22 ENCOUNTER — Encounter: Payer: Self-pay | Admitting: Podiatry

## 2021-04-22 ENCOUNTER — Ambulatory Visit (INDEPENDENT_AMBULATORY_CARE_PROVIDER_SITE_OTHER): Payer: Medicare HMO | Admitting: Podiatry

## 2021-04-22 ENCOUNTER — Other Ambulatory Visit: Payer: Self-pay

## 2021-04-22 DIAGNOSIS — M79674 Pain in right toe(s): Secondary | ICD-10-CM | POA: Diagnosis not present

## 2021-04-22 DIAGNOSIS — E1159 Type 2 diabetes mellitus with other circulatory complications: Secondary | ICD-10-CM | POA: Diagnosis not present

## 2021-04-22 DIAGNOSIS — I739 Peripheral vascular disease, unspecified: Secondary | ICD-10-CM | POA: Diagnosis not present

## 2021-04-22 DIAGNOSIS — B351 Tinea unguium: Secondary | ICD-10-CM | POA: Diagnosis not present

## 2021-04-22 DIAGNOSIS — M79675 Pain in left toe(s): Secondary | ICD-10-CM

## 2021-04-22 DIAGNOSIS — L608 Other nail disorders: Secondary | ICD-10-CM

## 2021-04-22 NOTE — Progress Notes (Signed)
This patient returns to my office for at risk foot care.  This patient requires this care by a professional since this patient will be at risk due to having diabetes with vascular disease.  This patient is unable to cut nails himself since the patient cannot reach his nails.These nails are painful walking and wearing shoes.  This patient presents for at risk foot care today.  General Appearance  Alert, conversant and in no acute stress.  Vascular  Dorsalis pedis and posterior tibial  pulses are weakly palpable  bilaterally.  Capillary return is within normal limits  bilaterally. Temperature is within normal limits  bilaterally.  Neurologic  Senn-Weinstein monofilament wire test within normal limits  bilaterally. Muscle power within normal limits bilaterally.  Nails Thick disfigured discolored nails with subungual debris  with incurvation hallux nails  B/L.. No evidence of bacterial infection or drainage bilaterally.  Orthopedic  No limitations of motion  feet .  No crepitus or effusions noted.  No bony pathology or digital deformities noted.  Skin  normotropic skin with no porokeratosis noted bilaterally.  No signs of infections or ulcers noted.     Onychomycosis  Pain in right toes  Pain in left toes  Consent was obtained for treatment procedures.   Mechanical debridement of hallux nails   bilaterally performed with a nail nipper.  Filed with dremel without incident.    Return office visit   9 weeks                   Told patient to return for periodic foot care and evaluation due to potential at risk complications.   Jona Zappone DPM  

## 2021-05-11 ENCOUNTER — Other Ambulatory Visit: Payer: Self-pay | Admitting: Urology

## 2021-05-17 ENCOUNTER — Telehealth: Payer: Self-pay | Admitting: Family Medicine

## 2021-05-17 NOTE — Telephone Encounter (Signed)
Patient called and states Sildenafil is not working. He states he has tried the injections in the past and seemed to work. Is he able to try injections again?

## 2021-05-18 NOTE — Telephone Encounter (Signed)
If he is comfortable with injections and knows the what he was injecting and dose I can send in Rx.  If he needs refresher injection training can schedule an appointment with Carollee Herter and can use an Edex injection if we have any in stock

## 2021-05-18 NOTE — Telephone Encounter (Signed)
Spoke to patient and he does not remember the presciption he had in the past. He also does not remember how much he injected. I offered to send in a test dose of the Trimix to [facility] and schedule an appointment with [facility] for titration. Patient said he will call back.

## 2021-07-29 ENCOUNTER — Encounter: Payer: Self-pay | Admitting: Podiatry

## 2021-07-29 ENCOUNTER — Ambulatory Visit: Payer: Medicare HMO | Admitting: Podiatry

## 2021-07-29 DIAGNOSIS — B351 Tinea unguium: Secondary | ICD-10-CM

## 2021-07-29 DIAGNOSIS — E1159 Type 2 diabetes mellitus with other circulatory complications: Secondary | ICD-10-CM

## 2021-07-29 DIAGNOSIS — L608 Other nail disorders: Secondary | ICD-10-CM

## 2021-07-29 DIAGNOSIS — I739 Peripheral vascular disease, unspecified: Secondary | ICD-10-CM

## 2021-07-29 DIAGNOSIS — M79675 Pain in left toe(s): Secondary | ICD-10-CM

## 2021-07-29 DIAGNOSIS — M79674 Pain in right toe(s): Secondary | ICD-10-CM

## 2021-07-29 NOTE — Progress Notes (Signed)
This patient returns to my office for at risk foot care.  This patient requires this care by a professional since this patient will be at risk due to having diabetes with vascular disease.  This patient is unable to cut nails himself since the patient cannot reach his nails.These nails are painful walking and wearing shoes.  This patient presents for at risk foot care today.  General Appearance  Alert, conversant and in no acute stress.  Vascular  Dorsalis pedis and posterior tibial  pulses are weakly palpable  bilaterally.  Capillary return is within normal limits  bilaterally. Temperature is within normal limits  bilaterally.  Neurologic  Senn-Weinstein monofilament wire test within normal limits  bilaterally. Muscle power within normal limits bilaterally.  Nails Thick disfigured discolored nails with subungual debris  with incurvation hallux nails  B/L.. No evidence of bacterial infection or drainage bilaterally.  Orthopedic  No limitations of motion  feet .  No crepitus or effusions noted.  No bony pathology or digital deformities noted.  Skin  normotropic skin with no porokeratosis noted bilaterally.  No signs of infections or ulcers noted.     Onychomycosis  Pain in right toes  Pain in left toes  Consent was obtained for treatment procedures.   Mechanical debridement of hallux nails   bilaterally performed with a nail nipper.  Filed with dremel without incident.    Return office visit   9 weeks                   Told patient to return for periodic foot care and evaluation due to potential at risk complications.   Crimson Beer DPM  

## 2021-09-27 ENCOUNTER — Telehealth: Payer: Self-pay | Admitting: *Deleted

## 2021-09-27 NOTE — Telephone Encounter (Signed)
"  Someone called me and I missed it. It might be because I have an appt coming up. Please call me back to confirm"

## 2021-09-30 ENCOUNTER — Ambulatory Visit (INDEPENDENT_AMBULATORY_CARE_PROVIDER_SITE_OTHER): Payer: Medicare HMO | Admitting: Podiatry

## 2021-09-30 ENCOUNTER — Encounter: Payer: Self-pay | Admitting: Podiatry

## 2021-09-30 DIAGNOSIS — I739 Peripheral vascular disease, unspecified: Secondary | ICD-10-CM

## 2021-09-30 DIAGNOSIS — B351 Tinea unguium: Secondary | ICD-10-CM | POA: Diagnosis not present

## 2021-09-30 DIAGNOSIS — M79675 Pain in left toe(s): Secondary | ICD-10-CM

## 2021-09-30 DIAGNOSIS — M79674 Pain in right toe(s): Secondary | ICD-10-CM

## 2021-09-30 DIAGNOSIS — E1159 Type 2 diabetes mellitus with other circulatory complications: Secondary | ICD-10-CM

## 2021-09-30 DIAGNOSIS — L608 Other nail disorders: Secondary | ICD-10-CM

## 2021-09-30 NOTE — Progress Notes (Signed)
This patient returns to my office for at risk foot care.  This patient requires this care by a professional since this patient will be at risk due to having diabetes with vascular disease.  This patient is unable to cut nails himself since the patient cannot reach his nails.These nails are painful walking and wearing shoes.  This patient presents for at risk foot care today.  General Appearance  Alert, conversant and in no acute stress.  Vascular  Dorsalis pedis and posterior tibial  pulses are weakly palpable  bilaterally.  Capillary return is within normal limits  bilaterally. Temperature is within normal limits  bilaterally.  Neurologic  Senn-Weinstein monofilament wire test within normal limits  bilaterally. Muscle power within normal limits bilaterally.  Nails Thick disfigured discolored nails with subungual debris  with incurvation hallux nails  B/L.. No evidence of bacterial infection or drainage bilaterally.  Orthopedic  No limitations of motion  feet .  No crepitus or effusions noted.  No bony pathology or digital deformities noted.  Skin  normotropic skin with no porokeratosis noted bilaterally.  No signs of infections or ulcers noted.     Onychomycosis  Pain in right toes  Pain in left toes  Consent was obtained for treatment procedures.   Mechanical debridement of hallux nails   bilaterally performed with a nail nipper.  Filed with dremel without incident.    Return office visit   9 weeks                   Told patient to return for periodic foot care and evaluation due to potential at risk complications.   Havoc Sanluis DPM  

## 2021-11-11 DIAGNOSIS — I10 Essential (primary) hypertension: Secondary | ICD-10-CM | POA: Diagnosis not present

## 2021-11-11 DIAGNOSIS — D631 Anemia in chronic kidney disease: Secondary | ICD-10-CM | POA: Diagnosis not present

## 2021-11-11 DIAGNOSIS — N1832 Chronic kidney disease, stage 3b: Secondary | ICD-10-CM | POA: Diagnosis not present

## 2021-11-11 DIAGNOSIS — E1122 Type 2 diabetes mellitus with diabetic chronic kidney disease: Secondary | ICD-10-CM | POA: Diagnosis not present

## 2021-11-18 DIAGNOSIS — E1169 Type 2 diabetes mellitus with other specified complication: Secondary | ICD-10-CM | POA: Diagnosis not present

## 2021-11-18 DIAGNOSIS — D649 Anemia, unspecified: Secondary | ICD-10-CM | POA: Diagnosis not present

## 2021-11-18 DIAGNOSIS — I10 Essential (primary) hypertension: Secondary | ICD-10-CM | POA: Diagnosis not present

## 2021-11-18 DIAGNOSIS — E785 Hyperlipidemia, unspecified: Secondary | ICD-10-CM | POA: Diagnosis not present

## 2021-11-18 DIAGNOSIS — N1831 Chronic kidney disease, stage 3a: Secondary | ICD-10-CM | POA: Diagnosis not present

## 2021-11-18 DIAGNOSIS — Z794 Long term (current) use of insulin: Secondary | ICD-10-CM | POA: Diagnosis not present

## 2021-12-09 ENCOUNTER — Ambulatory Visit: Payer: Medicare HMO | Admitting: Podiatry

## 2021-12-09 ENCOUNTER — Encounter: Payer: Self-pay | Admitting: Podiatry

## 2021-12-09 DIAGNOSIS — I739 Peripheral vascular disease, unspecified: Secondary | ICD-10-CM | POA: Diagnosis not present

## 2021-12-09 DIAGNOSIS — B351 Tinea unguium: Secondary | ICD-10-CM | POA: Diagnosis not present

## 2021-12-09 DIAGNOSIS — E1159 Type 2 diabetes mellitus with other circulatory complications: Secondary | ICD-10-CM | POA: Diagnosis not present

## 2021-12-09 DIAGNOSIS — M79674 Pain in right toe(s): Secondary | ICD-10-CM | POA: Diagnosis not present

## 2021-12-09 DIAGNOSIS — L608 Other nail disorders: Secondary | ICD-10-CM

## 2021-12-09 DIAGNOSIS — M79675 Pain in left toe(s): Secondary | ICD-10-CM

## 2021-12-09 NOTE — Progress Notes (Signed)
This patient returns to my office for at risk foot care.  This patient requires this care by a professional since this patient will be at risk due to having diabetes with vascular disease.  This patient is unable to cut nails himself since the patient cannot reach his nails.These nails are painful walking and wearing shoes.  This patient presents for at risk foot care today.  General Appearance  Alert, conversant and in no acute stress.  Vascular  Dorsalis pedis and posterior tibial  pulses are weakly palpable  bilaterally.  Capillary return is within normal limits  bilaterally. Temperature is within normal limits  bilaterally.  Neurologic  Senn-Weinstein monofilament wire test within normal limits  bilaterally. Muscle power within normal limits bilaterally.  Nails Thick disfigured discolored nails with subungual debris  with incurvation hallux nails  B/L.. No evidence of bacterial infection or drainage bilaterally.  Orthopedic  No limitations of motion  feet .  No crepitus or effusions noted.  No bony pathology or digital deformities noted.  Skin  normotropic skin with no porokeratosis noted bilaterally.  No signs of infections or ulcers noted.     Onychomycosis  Pain in right toes  Pain in left toes  Consent was obtained for treatment procedures.   Mechanical debridement of hallux nails   bilaterally performed with a nail nipper.  Filed with dremel without incident.    Return office visit   9 weeks                   Told patient to return for periodic foot care and evaluation due to potential at risk complications.   Finnian Husted DPM  

## 2021-12-29 DIAGNOSIS — E119 Type 2 diabetes mellitus without complications: Secondary | ICD-10-CM | POA: Diagnosis not present

## 2022-01-04 DIAGNOSIS — E669 Obesity, unspecified: Secondary | ICD-10-CM | POA: Diagnosis not present

## 2022-01-04 DIAGNOSIS — N189 Chronic kidney disease, unspecified: Secondary | ICD-10-CM | POA: Diagnosis not present

## 2022-01-04 DIAGNOSIS — Z6836 Body mass index (BMI) 36.0-36.9, adult: Secondary | ICD-10-CM | POA: Diagnosis not present

## 2022-01-04 DIAGNOSIS — E1122 Type 2 diabetes mellitus with diabetic chronic kidney disease: Secondary | ICD-10-CM | POA: Diagnosis not present

## 2022-01-04 DIAGNOSIS — I129 Hypertensive chronic kidney disease with stage 1 through stage 4 chronic kidney disease, or unspecified chronic kidney disease: Secondary | ICD-10-CM | POA: Diagnosis not present

## 2022-01-04 DIAGNOSIS — Z794 Long term (current) use of insulin: Secondary | ICD-10-CM | POA: Diagnosis not present

## 2022-01-04 DIAGNOSIS — E785 Hyperlipidemia, unspecified: Secondary | ICD-10-CM | POA: Diagnosis not present

## 2022-02-07 ENCOUNTER — Encounter (INDEPENDENT_AMBULATORY_CARE_PROVIDER_SITE_OTHER): Payer: Self-pay

## 2022-02-10 ENCOUNTER — Ambulatory Visit (INDEPENDENT_AMBULATORY_CARE_PROVIDER_SITE_OTHER): Payer: Medicare HMO | Admitting: Podiatry

## 2022-02-10 ENCOUNTER — Encounter: Payer: Self-pay | Admitting: Podiatry

## 2022-02-10 DIAGNOSIS — M79675 Pain in left toe(s): Secondary | ICD-10-CM

## 2022-02-10 DIAGNOSIS — L608 Other nail disorders: Secondary | ICD-10-CM

## 2022-02-10 DIAGNOSIS — B351 Tinea unguium: Secondary | ICD-10-CM

## 2022-02-10 DIAGNOSIS — M79674 Pain in right toe(s): Secondary | ICD-10-CM

## 2022-02-10 DIAGNOSIS — I739 Peripheral vascular disease, unspecified: Secondary | ICD-10-CM

## 2022-02-10 NOTE — Progress Notes (Signed)
This patient returns to my office for at risk foot care.  This patient requires this care by a professional since this patient will be at risk due to having diabetes with vascular disease.  This patient is unable to cut nails himself since the patient cannot reach his nails.These nails are painful walking and wearing shoes.  This patient presents for at risk foot care today.  General Appearance  Alert, conversant and in no acute stress.  Vascular  Dorsalis pedis and posterior tibial  pulses are weakly palpable  bilaterally.  Capillary return is within normal limits  bilaterally. Temperature is within normal limits  bilaterally.  Neurologic  Senn-Weinstein monofilament wire test within normal limits  bilaterally. Muscle power within normal limits bilaterally.  Nails Thick disfigured discolored nails with subungual debris  with incurvation hallux nails  B/L.. No evidence of bacterial infection or drainage bilaterally.  Orthopedic  No limitations of motion  feet .  No crepitus or effusions noted.  No bony pathology or digital deformities noted.  Skin  normotropic skin with no porokeratosis noted bilaterally.  No signs of infections or ulcers noted.     Onychomycosis  Pain in right toes  Pain in left toes  Consent was obtained for treatment procedures.   Mechanical debridement of hallux nails   bilaterally performed with a nail nipper.  Filed with dremel without incident.    Return office visit   9 weeks                   Told patient to return for periodic foot care and evaluation due to potential at risk complications.   Akyah Lagrange DPM  

## 2022-03-17 DIAGNOSIS — N1832 Chronic kidney disease, stage 3b: Secondary | ICD-10-CM | POA: Diagnosis not present

## 2022-03-17 DIAGNOSIS — E1122 Type 2 diabetes mellitus with diabetic chronic kidney disease: Secondary | ICD-10-CM | POA: Diagnosis not present

## 2022-03-17 DIAGNOSIS — I10 Essential (primary) hypertension: Secondary | ICD-10-CM | POA: Diagnosis not present

## 2022-03-17 DIAGNOSIS — D631 Anemia in chronic kidney disease: Secondary | ICD-10-CM | POA: Diagnosis not present

## 2022-04-12 DIAGNOSIS — G8929 Other chronic pain: Secondary | ICD-10-CM | POA: Diagnosis not present

## 2022-04-12 DIAGNOSIS — M545 Low back pain, unspecified: Secondary | ICD-10-CM | POA: Diagnosis not present

## 2022-04-18 DIAGNOSIS — G8929 Other chronic pain: Secondary | ICD-10-CM | POA: Diagnosis not present

## 2022-04-18 DIAGNOSIS — M545 Low back pain, unspecified: Secondary | ICD-10-CM | POA: Diagnosis not present

## 2022-04-21 DIAGNOSIS — M545 Low back pain, unspecified: Secondary | ICD-10-CM | POA: Diagnosis not present

## 2022-04-21 DIAGNOSIS — G8929 Other chronic pain: Secondary | ICD-10-CM | POA: Diagnosis not present

## 2022-04-25 DIAGNOSIS — G8929 Other chronic pain: Secondary | ICD-10-CM | POA: Diagnosis not present

## 2022-04-25 DIAGNOSIS — M545 Low back pain, unspecified: Secondary | ICD-10-CM | POA: Diagnosis not present

## 2022-04-28 ENCOUNTER — Encounter: Payer: Self-pay | Admitting: Podiatry

## 2022-04-28 ENCOUNTER — Ambulatory Visit: Payer: Medicare HMO | Admitting: Podiatry

## 2022-04-28 VITALS — BP 184/81 | HR 57

## 2022-04-28 DIAGNOSIS — L608 Other nail disorders: Secondary | ICD-10-CM

## 2022-04-28 DIAGNOSIS — I739 Peripheral vascular disease, unspecified: Secondary | ICD-10-CM

## 2022-04-28 DIAGNOSIS — B351 Tinea unguium: Secondary | ICD-10-CM | POA: Diagnosis not present

## 2022-04-28 DIAGNOSIS — M79675 Pain in left toe(s): Secondary | ICD-10-CM | POA: Diagnosis not present

## 2022-04-28 DIAGNOSIS — M79674 Pain in right toe(s): Secondary | ICD-10-CM | POA: Diagnosis not present

## 2022-04-28 DIAGNOSIS — E1159 Type 2 diabetes mellitus with other circulatory complications: Secondary | ICD-10-CM

## 2022-04-28 NOTE — Progress Notes (Signed)
This patient returns to my office for at risk foot care.  This patient requires this care by a professional since this patient will be at risk due to having diabetes with vascular disease.  This patient is unable to cut nails himself since the patient cannot reach his nails.These nails are painful walking and wearing shoes.  This patient presents for at risk foot care today.  General Appearance  Alert, conversant and in no acute stress.  Vascular  Dorsalis pedis and posterior tibial  pulses are weakly palpable  bilaterally.  Capillary return is within normal limits  bilaterally. Temperature is within normal limits  bilaterally.  Neurologic  Senn-Weinstein monofilament wire test within normal limits  bilaterally. Muscle power within normal limits bilaterally.  Nails Thick disfigured discolored nails with subungual debris  with incurvation hallux nails  B/L.Marland Kitchen No evidence of bacterial infection or drainage bilaterally.  Orthopedic  No limitations of motion  feet .  No crepitus or effusions noted.  No bony pathology or digital deformities noted.  Skin  normotropic skin with no porokeratosis noted bilaterally.  No signs of infections or ulcers noted.     Onychomycosis  Pain in right toes  Pain in left toes  Consent was obtained for treatment procedures.   Mechanical debridement of hallux nails   bilaterally performed with a nail nipper.  Filed with dremel without incident.    Return office visit   9 weeks                   Told patient to return for periodic foot care and evaluation due to potential at risk complications.   Gardiner Barefoot DPM

## 2022-05-02 DIAGNOSIS — G8929 Other chronic pain: Secondary | ICD-10-CM | POA: Diagnosis not present

## 2022-05-02 DIAGNOSIS — M545 Low back pain, unspecified: Secondary | ICD-10-CM | POA: Diagnosis not present

## 2022-05-04 DIAGNOSIS — E119 Type 2 diabetes mellitus without complications: Secondary | ICD-10-CM | POA: Diagnosis not present

## 2022-06-01 DIAGNOSIS — E1140 Type 2 diabetes mellitus with diabetic neuropathy, unspecified: Secondary | ICD-10-CM | POA: Diagnosis not present

## 2022-06-01 DIAGNOSIS — L600 Ingrowing nail: Secondary | ICD-10-CM | POA: Diagnosis not present

## 2022-06-01 DIAGNOSIS — Z961 Presence of intraocular lens: Secondary | ICD-10-CM | POA: Diagnosis not present

## 2022-06-01 DIAGNOSIS — L851 Acquired keratosis [keratoderma] palmaris et plantaris: Secondary | ICD-10-CM | POA: Diagnosis not present

## 2022-06-01 DIAGNOSIS — Z794 Long term (current) use of insulin: Secondary | ICD-10-CM | POA: Diagnosis not present

## 2022-06-01 DIAGNOSIS — Z0100 Encounter for examination of eyes and vision without abnormal findings: Secondary | ICD-10-CM | POA: Diagnosis not present

## 2022-06-01 DIAGNOSIS — Z9889 Other specified postprocedural states: Secondary | ICD-10-CM | POA: Diagnosis not present

## 2022-06-01 DIAGNOSIS — M79675 Pain in left toe(s): Secondary | ICD-10-CM | POA: Diagnosis not present

## 2022-06-01 DIAGNOSIS — B351 Tinea unguium: Secondary | ICD-10-CM | POA: Diagnosis not present

## 2022-06-01 DIAGNOSIS — M79674 Pain in right toe(s): Secondary | ICD-10-CM | POA: Diagnosis not present

## 2022-06-09 DIAGNOSIS — Z0100 Encounter for examination of eyes and vision without abnormal findings: Secondary | ICD-10-CM | POA: Diagnosis not present

## 2022-06-22 DIAGNOSIS — H5789 Other specified disorders of eye and adnexa: Secondary | ICD-10-CM | POA: Diagnosis not present

## 2022-07-12 DIAGNOSIS — E1122 Type 2 diabetes mellitus with diabetic chronic kidney disease: Secondary | ICD-10-CM | POA: Diagnosis not present

## 2022-07-12 DIAGNOSIS — N1832 Chronic kidney disease, stage 3b: Secondary | ICD-10-CM | POA: Diagnosis not present

## 2022-07-12 DIAGNOSIS — N2581 Secondary hyperparathyroidism of renal origin: Secondary | ICD-10-CM | POA: Diagnosis not present

## 2022-07-12 DIAGNOSIS — I10 Essential (primary) hypertension: Secondary | ICD-10-CM | POA: Diagnosis not present

## 2022-07-12 DIAGNOSIS — D631 Anemia in chronic kidney disease: Secondary | ICD-10-CM | POA: Diagnosis not present

## 2022-07-21 ENCOUNTER — Ambulatory Visit: Payer: Medicare HMO | Admitting: Podiatry

## 2022-07-26 DIAGNOSIS — Z794 Long term (current) use of insulin: Secondary | ICD-10-CM | POA: Diagnosis not present

## 2022-07-26 DIAGNOSIS — E1122 Type 2 diabetes mellitus with diabetic chronic kidney disease: Secondary | ICD-10-CM | POA: Diagnosis not present

## 2022-07-26 DIAGNOSIS — N1832 Chronic kidney disease, stage 3b: Secondary | ICD-10-CM | POA: Diagnosis not present

## 2022-07-26 DIAGNOSIS — E785 Hyperlipidemia, unspecified: Secondary | ICD-10-CM | POA: Diagnosis not present

## 2022-07-26 DIAGNOSIS — E1169 Type 2 diabetes mellitus with other specified complication: Secondary | ICD-10-CM | POA: Diagnosis not present

## 2022-07-26 DIAGNOSIS — I10 Essential (primary) hypertension: Secondary | ICD-10-CM | POA: Diagnosis not present

## 2022-07-26 DIAGNOSIS — Z6837 Body mass index (BMI) 37.0-37.9, adult: Secondary | ICD-10-CM | POA: Diagnosis not present

## 2022-08-02 DIAGNOSIS — D631 Anemia in chronic kidney disease: Secondary | ICD-10-CM | POA: Diagnosis not present

## 2022-08-02 DIAGNOSIS — N189 Chronic kidney disease, unspecified: Secondary | ICD-10-CM | POA: Diagnosis not present

## 2022-08-02 DIAGNOSIS — Z6837 Body mass index (BMI) 37.0-37.9, adult: Secondary | ICD-10-CM | POA: Diagnosis not present

## 2022-08-02 DIAGNOSIS — I129 Hypertensive chronic kidney disease with stage 1 through stage 4 chronic kidney disease, or unspecified chronic kidney disease: Secondary | ICD-10-CM | POA: Diagnosis not present

## 2022-08-02 DIAGNOSIS — E1122 Type 2 diabetes mellitus with diabetic chronic kidney disease: Secondary | ICD-10-CM | POA: Diagnosis not present

## 2022-08-02 DIAGNOSIS — Z1331 Encounter for screening for depression: Secondary | ICD-10-CM | POA: Diagnosis not present

## 2022-08-02 DIAGNOSIS — Z794 Long term (current) use of insulin: Secondary | ICD-10-CM | POA: Diagnosis not present

## 2022-08-02 DIAGNOSIS — Z0000 Encounter for general adult medical examination without abnormal findings: Secondary | ICD-10-CM | POA: Diagnosis not present

## 2022-08-02 DIAGNOSIS — E669 Obesity, unspecified: Secondary | ICD-10-CM | POA: Diagnosis not present

## 2022-09-01 DIAGNOSIS — L851 Acquired keratosis [keratoderma] palmaris et plantaris: Secondary | ICD-10-CM | POA: Diagnosis not present

## 2022-09-01 DIAGNOSIS — E1140 Type 2 diabetes mellitus with diabetic neuropathy, unspecified: Secondary | ICD-10-CM | POA: Diagnosis not present

## 2022-09-01 DIAGNOSIS — Z794 Long term (current) use of insulin: Secondary | ICD-10-CM | POA: Diagnosis not present

## 2022-09-01 DIAGNOSIS — B351 Tinea unguium: Secondary | ICD-10-CM | POA: Diagnosis not present

## 2022-09-07 DIAGNOSIS — E119 Type 2 diabetes mellitus without complications: Secondary | ICD-10-CM | POA: Diagnosis not present

## 2022-10-28 DIAGNOSIS — Z794 Long term (current) use of insulin: Secondary | ICD-10-CM | POA: Diagnosis not present

## 2022-10-28 DIAGNOSIS — I10 Essential (primary) hypertension: Secondary | ICD-10-CM | POA: Diagnosis not present

## 2022-10-28 DIAGNOSIS — D649 Anemia, unspecified: Secondary | ICD-10-CM | POA: Diagnosis not present

## 2022-10-28 DIAGNOSIS — E1122 Type 2 diabetes mellitus with diabetic chronic kidney disease: Secondary | ICD-10-CM | POA: Diagnosis not present

## 2022-10-28 DIAGNOSIS — E1169 Type 2 diabetes mellitus with other specified complication: Secondary | ICD-10-CM | POA: Diagnosis not present

## 2022-10-28 DIAGNOSIS — N1832 Chronic kidney disease, stage 3b: Secondary | ICD-10-CM | POA: Diagnosis not present

## 2022-10-28 DIAGNOSIS — N182 Chronic kidney disease, stage 2 (mild): Secondary | ICD-10-CM | POA: Diagnosis not present

## 2022-10-28 DIAGNOSIS — Z0000 Encounter for general adult medical examination without abnormal findings: Secondary | ICD-10-CM | POA: Diagnosis not present

## 2022-10-31 DIAGNOSIS — E1122 Type 2 diabetes mellitus with diabetic chronic kidney disease: Secondary | ICD-10-CM | POA: Diagnosis not present

## 2022-10-31 DIAGNOSIS — Z0000 Encounter for general adult medical examination without abnormal findings: Secondary | ICD-10-CM | POA: Diagnosis not present

## 2022-10-31 DIAGNOSIS — Z794 Long term (current) use of insulin: Secondary | ICD-10-CM | POA: Diagnosis not present

## 2022-10-31 DIAGNOSIS — D649 Anemia, unspecified: Secondary | ICD-10-CM | POA: Diagnosis not present

## 2022-10-31 DIAGNOSIS — I10 Essential (primary) hypertension: Secondary | ICD-10-CM | POA: Diagnosis not present

## 2022-10-31 DIAGNOSIS — N1832 Chronic kidney disease, stage 3b: Secondary | ICD-10-CM | POA: Diagnosis not present

## 2022-10-31 DIAGNOSIS — E1169 Type 2 diabetes mellitus with other specified complication: Secondary | ICD-10-CM | POA: Diagnosis not present

## 2022-10-31 DIAGNOSIS — N182 Chronic kidney disease, stage 2 (mild): Secondary | ICD-10-CM | POA: Diagnosis not present

## 2022-11-04 DIAGNOSIS — Z794 Long term (current) use of insulin: Secondary | ICD-10-CM | POA: Diagnosis not present

## 2022-11-04 DIAGNOSIS — N189 Chronic kidney disease, unspecified: Secondary | ICD-10-CM | POA: Diagnosis not present

## 2022-11-04 DIAGNOSIS — E1122 Type 2 diabetes mellitus with diabetic chronic kidney disease: Secondary | ICD-10-CM | POA: Diagnosis not present

## 2022-11-04 DIAGNOSIS — I129 Hypertensive chronic kidney disease with stage 1 through stage 4 chronic kidney disease, or unspecified chronic kidney disease: Secondary | ICD-10-CM | POA: Diagnosis not present

## 2022-11-04 DIAGNOSIS — E785 Hyperlipidemia, unspecified: Secondary | ICD-10-CM | POA: Diagnosis not present

## 2022-11-04 DIAGNOSIS — E669 Obesity, unspecified: Secondary | ICD-10-CM | POA: Diagnosis not present

## 2022-11-04 DIAGNOSIS — Z6835 Body mass index (BMI) 35.0-35.9, adult: Secondary | ICD-10-CM | POA: Diagnosis not present

## 2022-11-04 DIAGNOSIS — K219 Gastro-esophageal reflux disease without esophagitis: Secondary | ICD-10-CM | POA: Diagnosis not present

## 2022-11-04 DIAGNOSIS — D631 Anemia in chronic kidney disease: Secondary | ICD-10-CM | POA: Diagnosis not present

## 2022-11-23 DIAGNOSIS — N1832 Chronic kidney disease, stage 3b: Secondary | ICD-10-CM | POA: Diagnosis not present

## 2022-11-23 DIAGNOSIS — I10 Essential (primary) hypertension: Secondary | ICD-10-CM | POA: Diagnosis not present

## 2022-11-23 DIAGNOSIS — N2581 Secondary hyperparathyroidism of renal origin: Secondary | ICD-10-CM | POA: Diagnosis not present

## 2022-11-23 DIAGNOSIS — E1122 Type 2 diabetes mellitus with diabetic chronic kidney disease: Secondary | ICD-10-CM | POA: Diagnosis not present

## 2022-11-23 DIAGNOSIS — D631 Anemia in chronic kidney disease: Secondary | ICD-10-CM | POA: Diagnosis not present

## 2022-11-24 DIAGNOSIS — K219 Gastro-esophageal reflux disease without esophagitis: Secondary | ICD-10-CM | POA: Diagnosis not present

## 2022-12-05 DIAGNOSIS — E1140 Type 2 diabetes mellitus with diabetic neuropathy, unspecified: Secondary | ICD-10-CM | POA: Diagnosis not present

## 2022-12-05 DIAGNOSIS — L851 Acquired keratosis [keratoderma] palmaris et plantaris: Secondary | ICD-10-CM | POA: Diagnosis not present

## 2022-12-05 DIAGNOSIS — Z794 Long term (current) use of insulin: Secondary | ICD-10-CM | POA: Diagnosis not present

## 2022-12-05 DIAGNOSIS — B351 Tinea unguium: Secondary | ICD-10-CM | POA: Diagnosis not present

## 2022-12-13 DIAGNOSIS — E119 Type 2 diabetes mellitus without complications: Secondary | ICD-10-CM | POA: Diagnosis not present

## 2023-02-23 DIAGNOSIS — Z794 Long term (current) use of insulin: Secondary | ICD-10-CM | POA: Diagnosis not present

## 2023-02-23 DIAGNOSIS — Z6837 Body mass index (BMI) 37.0-37.9, adult: Secondary | ICD-10-CM | POA: Diagnosis not present

## 2023-02-23 DIAGNOSIS — D649 Anemia, unspecified: Secondary | ICD-10-CM | POA: Diagnosis not present

## 2023-02-23 DIAGNOSIS — N182 Chronic kidney disease, stage 2 (mild): Secondary | ICD-10-CM | POA: Diagnosis not present

## 2023-02-23 DIAGNOSIS — I10 Essential (primary) hypertension: Secondary | ICD-10-CM | POA: Diagnosis not present

## 2023-02-23 DIAGNOSIS — E66812 Obesity, class 2: Secondary | ICD-10-CM | POA: Diagnosis not present

## 2023-02-23 DIAGNOSIS — E1169 Type 2 diabetes mellitus with other specified complication: Secondary | ICD-10-CM | POA: Diagnosis not present

## 2023-02-23 DIAGNOSIS — E785 Hyperlipidemia, unspecified: Secondary | ICD-10-CM | POA: Diagnosis not present

## 2023-03-02 DIAGNOSIS — Z6834 Body mass index (BMI) 34.0-34.9, adult: Secondary | ICD-10-CM | POA: Diagnosis not present

## 2023-03-02 DIAGNOSIS — I129 Hypertensive chronic kidney disease with stage 1 through stage 4 chronic kidney disease, or unspecified chronic kidney disease: Secondary | ICD-10-CM | POA: Diagnosis not present

## 2023-03-02 DIAGNOSIS — D631 Anemia in chronic kidney disease: Secondary | ICD-10-CM | POA: Diagnosis not present

## 2023-03-02 DIAGNOSIS — E1122 Type 2 diabetes mellitus with diabetic chronic kidney disease: Secondary | ICD-10-CM | POA: Diagnosis not present

## 2023-03-02 DIAGNOSIS — E785 Hyperlipidemia, unspecified: Secondary | ICD-10-CM | POA: Diagnosis not present

## 2023-03-02 DIAGNOSIS — E669 Obesity, unspecified: Secondary | ICD-10-CM | POA: Diagnosis not present

## 2023-03-02 DIAGNOSIS — Z794 Long term (current) use of insulin: Secondary | ICD-10-CM | POA: Diagnosis not present

## 2023-03-02 DIAGNOSIS — N189 Chronic kidney disease, unspecified: Secondary | ICD-10-CM | POA: Diagnosis not present

## 2023-03-13 DIAGNOSIS — E119 Type 2 diabetes mellitus without complications: Secondary | ICD-10-CM | POA: Diagnosis not present

## 2023-03-14 DIAGNOSIS — E1140 Type 2 diabetes mellitus with diabetic neuropathy, unspecified: Secondary | ICD-10-CM | POA: Diagnosis not present

## 2023-03-14 DIAGNOSIS — B351 Tinea unguium: Secondary | ICD-10-CM | POA: Diagnosis not present

## 2023-03-14 DIAGNOSIS — Z794 Long term (current) use of insulin: Secondary | ICD-10-CM | POA: Diagnosis not present

## 2023-03-14 DIAGNOSIS — L851 Acquired keratosis [keratoderma] palmaris et plantaris: Secondary | ICD-10-CM | POA: Diagnosis not present

## 2023-03-23 DIAGNOSIS — N1832 Chronic kidney disease, stage 3b: Secondary | ICD-10-CM | POA: Diagnosis not present

## 2023-03-23 DIAGNOSIS — N2581 Secondary hyperparathyroidism of renal origin: Secondary | ICD-10-CM | POA: Diagnosis not present

## 2023-03-23 DIAGNOSIS — I10 Essential (primary) hypertension: Secondary | ICD-10-CM | POA: Diagnosis not present

## 2023-03-23 DIAGNOSIS — D631 Anemia in chronic kidney disease: Secondary | ICD-10-CM | POA: Diagnosis not present

## 2023-03-23 DIAGNOSIS — E1122 Type 2 diabetes mellitus with diabetic chronic kidney disease: Secondary | ICD-10-CM | POA: Diagnosis not present

## 2023-05-02 IMAGING — US US RENAL
1 series · 14 of 25 positions shown · non-contrast
Comparison: None

CLINICAL DATA: Stage III A chronic kidney disease and type 2
diabetes mellitus.

EXAM:
RENAL / URINARY TRACT ULTRASOUND COMPLETE

[Series 1: us renal · 14 of 40 slices shown]
[im 1/40]
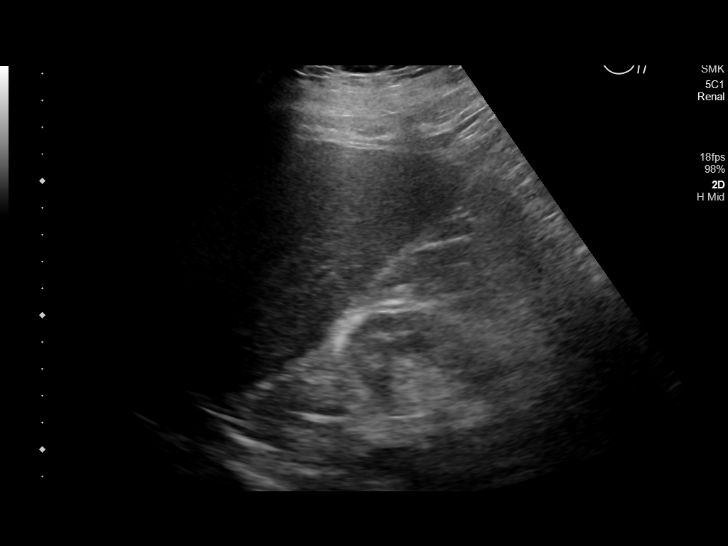
[im 4/40]
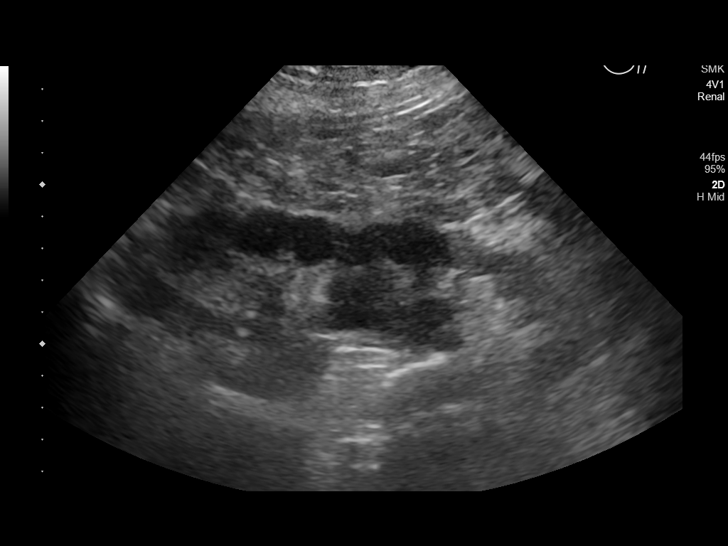
[im 7/40]
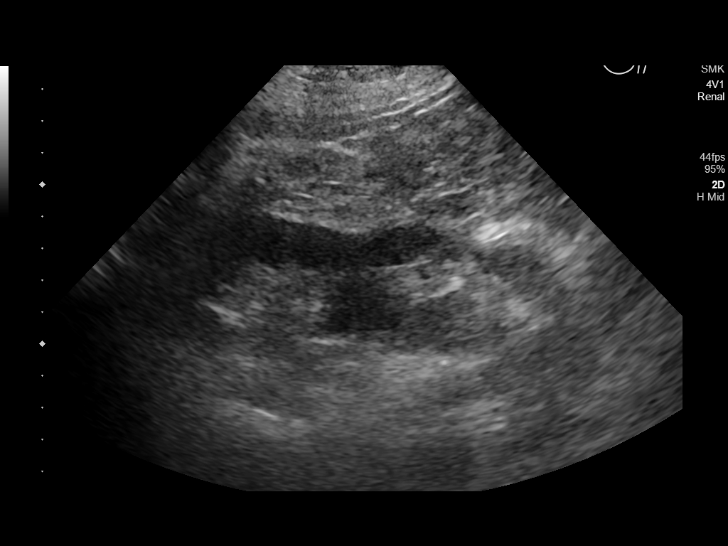
[im 10/40]
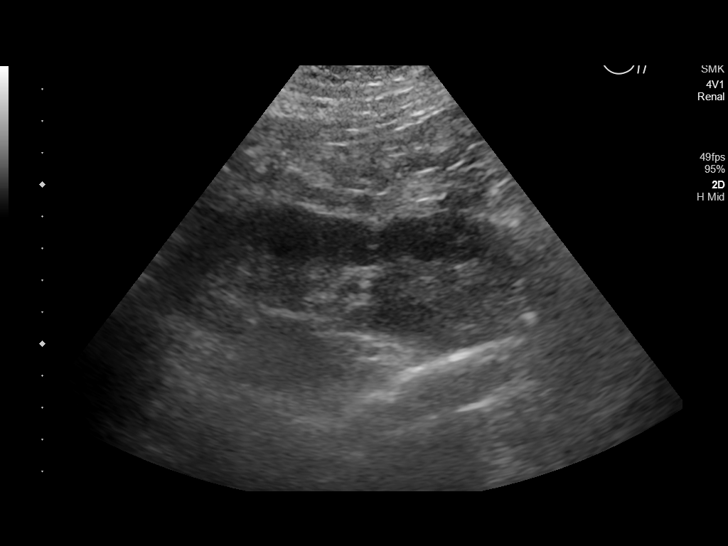
[im 14/40]
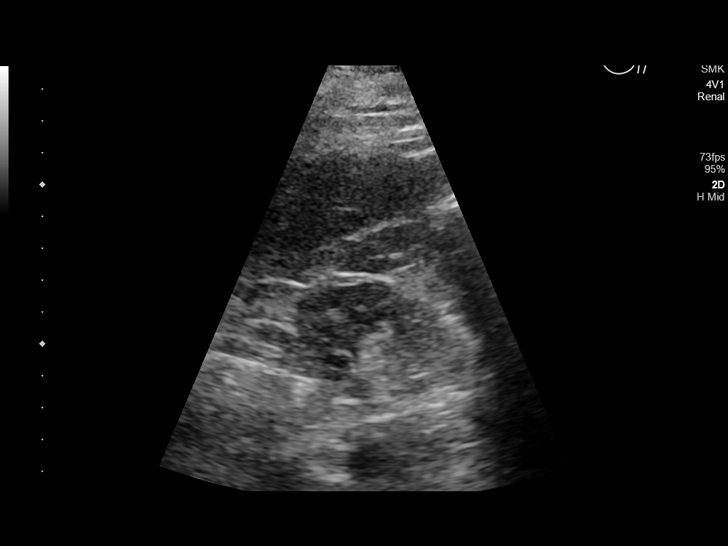
[im 15/40]
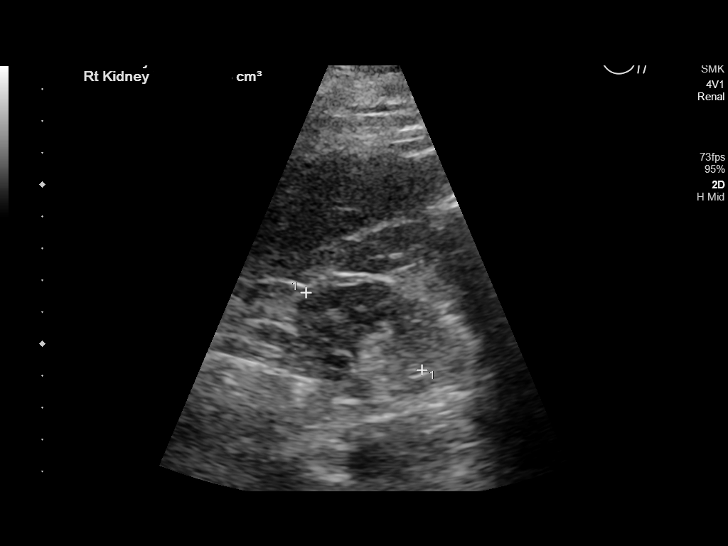
[im 18/40]
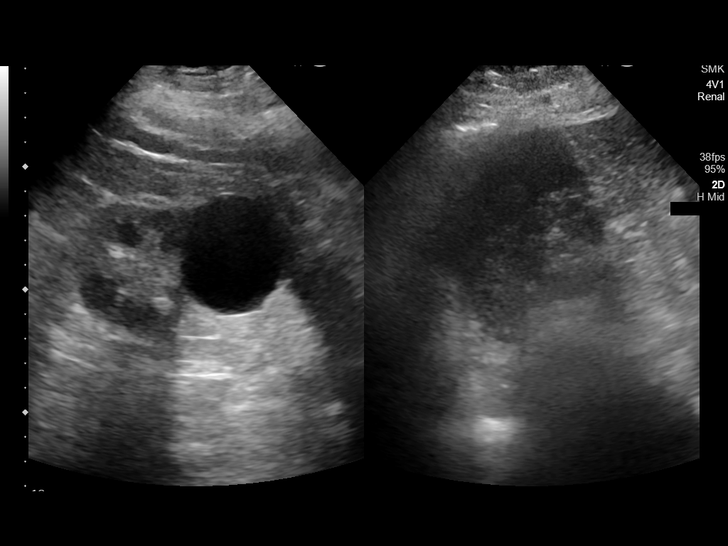
[im 22/40]
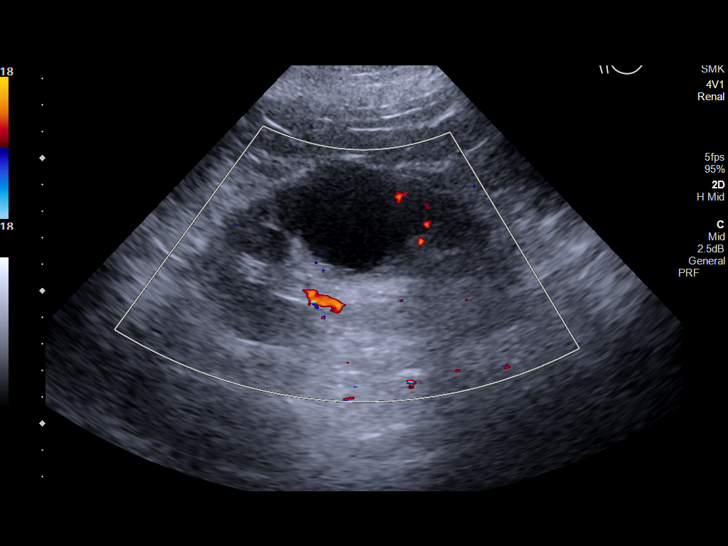
[im 25/40]
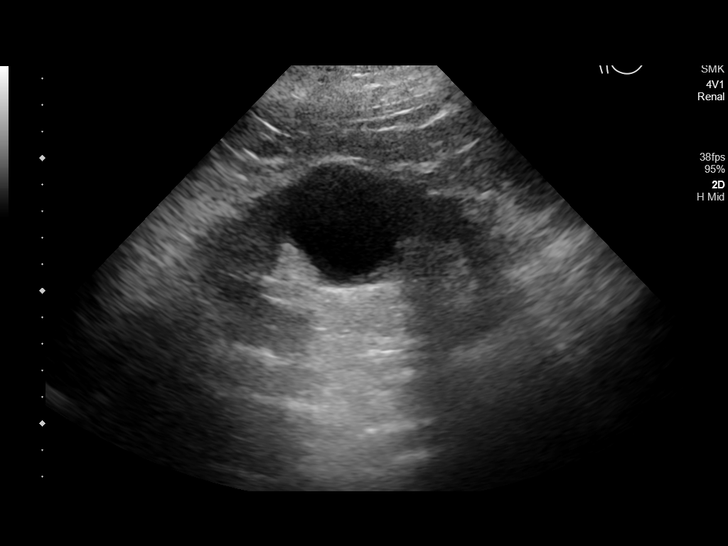
[im 27/40]
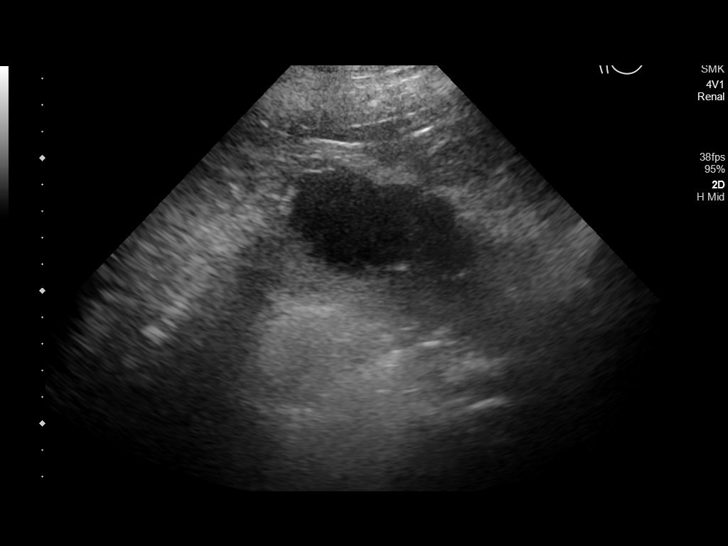
[im 30/40]
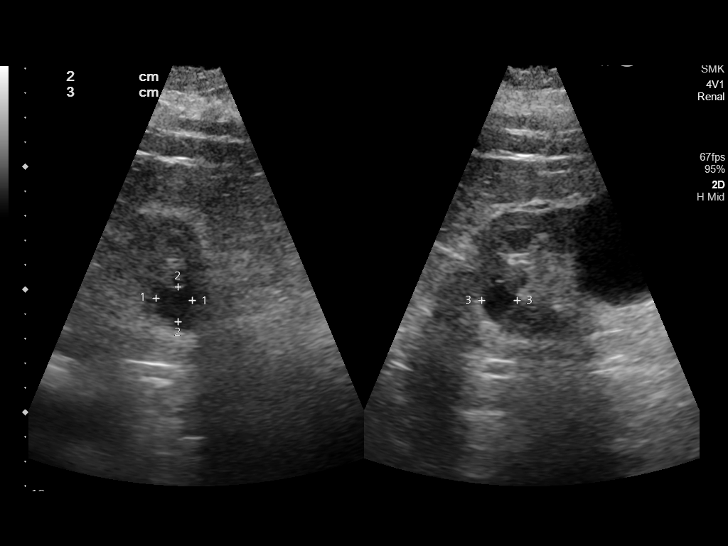
[im 33/40]
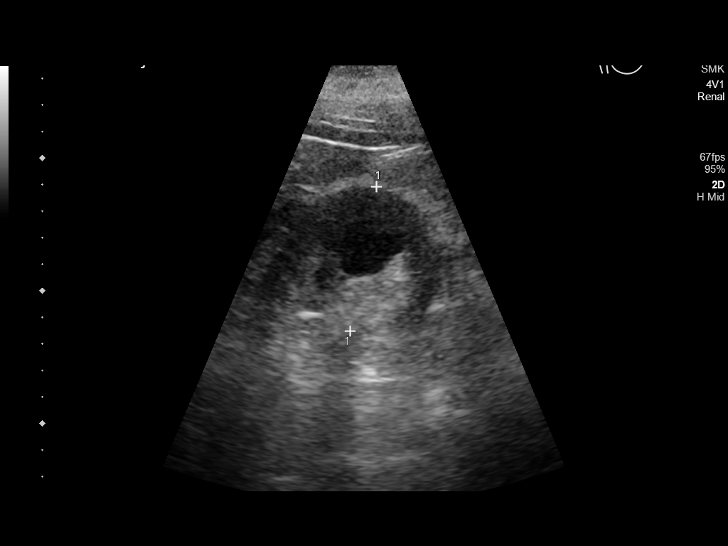
[im 36/40]
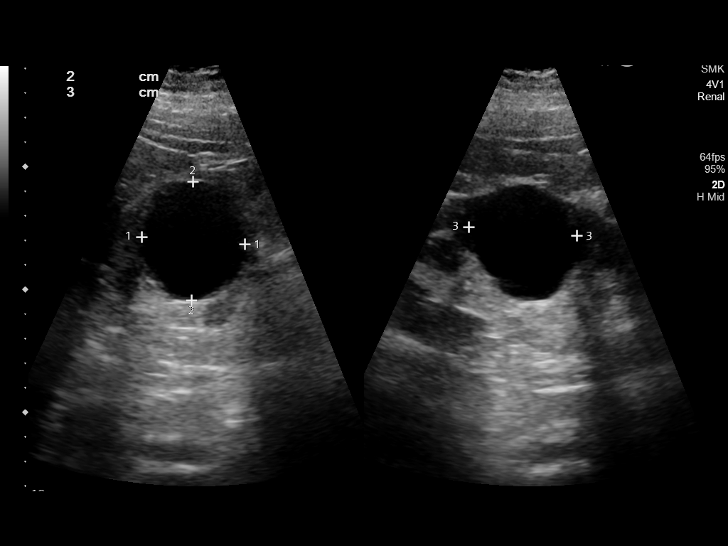
[im 40/40]
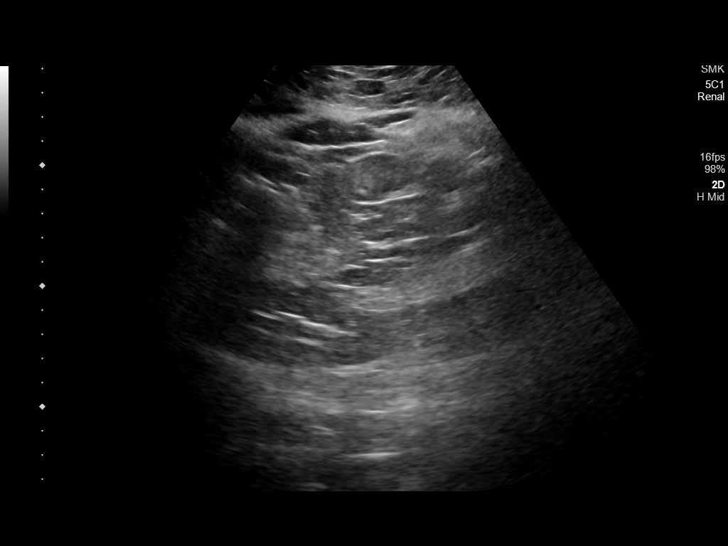

[14 of 25 positions shown; findings below may reference images not displayed]

FINDINGS: Right Kidney:

Renal measurements: 14.1 x 3.8 x 4.4 cm = volume: 124 mL.
Parenchymal thinning and cortical scarring with prominence of the
renal sinus echo complex. No hydronephrosis. No visible calculi.

Left Kidney:

Renal measurements: 15.2 x 6.5 x 5.5 cm = volume: 287 mL. Anechoic
cysts in the LEFT kidney largest in the interpolar LEFT kidney 4.4 x
4.8 x 4.2 cm. Cortical thinning and scarring not as pronounced as on
the RIGHT. No hydronephrosis.

Bladder:

Urinary bladder not visualized. Patient had voided prior to the
exam.

Other:

Limited assessment due to body habitus. Decreased corticomedullary
differentiation is suspected.
IMPRESSION: Cortical thinning in cortical scarring bilaterally without
hydronephrosis.

LEFT-sided renal cysts.

Decreased corticomedullary differentiation though with limited
assessment. This finding can be seen in the setting of medical renal
disease.

## 2023-06-13 DIAGNOSIS — E113293 Type 2 diabetes mellitus with mild nonproliferative diabetic retinopathy without macular edema, bilateral: Secondary | ICD-10-CM | POA: Diagnosis not present

## 2023-06-13 DIAGNOSIS — Z0100 Encounter for examination of eyes and vision without abnormal findings: Secondary | ICD-10-CM | POA: Diagnosis not present

## 2023-06-13 DIAGNOSIS — Z961 Presence of intraocular lens: Secondary | ICD-10-CM | POA: Diagnosis not present

## 2023-06-20 DIAGNOSIS — E1140 Type 2 diabetes mellitus with diabetic neuropathy, unspecified: Secondary | ICD-10-CM | POA: Diagnosis not present

## 2023-06-20 DIAGNOSIS — L851 Acquired keratosis [keratoderma] palmaris et plantaris: Secondary | ICD-10-CM | POA: Diagnosis not present

## 2023-06-20 DIAGNOSIS — Z794 Long term (current) use of insulin: Secondary | ICD-10-CM | POA: Diagnosis not present

## 2023-06-20 DIAGNOSIS — B351 Tinea unguium: Secondary | ICD-10-CM | POA: Diagnosis not present

## 2023-06-22 DIAGNOSIS — E785 Hyperlipidemia, unspecified: Secondary | ICD-10-CM | POA: Diagnosis not present

## 2023-06-22 DIAGNOSIS — E66812 Obesity, class 2: Secondary | ICD-10-CM | POA: Diagnosis not present

## 2023-06-22 DIAGNOSIS — Z794 Long term (current) use of insulin: Secondary | ICD-10-CM | POA: Diagnosis not present

## 2023-06-22 DIAGNOSIS — N182 Chronic kidney disease, stage 2 (mild): Secondary | ICD-10-CM | POA: Diagnosis not present

## 2023-06-22 DIAGNOSIS — I10 Essential (primary) hypertension: Secondary | ICD-10-CM | POA: Diagnosis not present

## 2023-06-22 DIAGNOSIS — Z6837 Body mass index (BMI) 37.0-37.9, adult: Secondary | ICD-10-CM | POA: Diagnosis not present

## 2023-06-22 DIAGNOSIS — D649 Anemia, unspecified: Secondary | ICD-10-CM | POA: Diagnosis not present

## 2023-06-22 DIAGNOSIS — E1169 Type 2 diabetes mellitus with other specified complication: Secondary | ICD-10-CM | POA: Diagnosis not present

## 2023-06-30 DIAGNOSIS — E119 Type 2 diabetes mellitus without complications: Secondary | ICD-10-CM | POA: Diagnosis not present

## 2023-07-03 DIAGNOSIS — Z794 Long term (current) use of insulin: Secondary | ICD-10-CM | POA: Diagnosis not present

## 2023-07-03 DIAGNOSIS — D631 Anemia in chronic kidney disease: Secondary | ICD-10-CM | POA: Diagnosis not present

## 2023-07-03 DIAGNOSIS — Z0000 Encounter for general adult medical examination without abnormal findings: Secondary | ICD-10-CM | POA: Diagnosis not present

## 2023-07-03 DIAGNOSIS — I129 Hypertensive chronic kidney disease with stage 1 through stage 4 chronic kidney disease, or unspecified chronic kidney disease: Secondary | ICD-10-CM | POA: Diagnosis not present

## 2023-07-03 DIAGNOSIS — E785 Hyperlipidemia, unspecified: Secondary | ICD-10-CM | POA: Diagnosis not present

## 2023-07-03 DIAGNOSIS — K219 Gastro-esophageal reflux disease without esophagitis: Secondary | ICD-10-CM | POA: Diagnosis not present

## 2023-07-03 DIAGNOSIS — E669 Obesity, unspecified: Secondary | ICD-10-CM | POA: Diagnosis not present

## 2023-07-03 DIAGNOSIS — N189 Chronic kidney disease, unspecified: Secondary | ICD-10-CM | POA: Diagnosis not present

## 2023-07-03 DIAGNOSIS — E1122 Type 2 diabetes mellitus with diabetic chronic kidney disease: Secondary | ICD-10-CM | POA: Diagnosis not present

## 2023-07-11 DIAGNOSIS — I10 Essential (primary) hypertension: Secondary | ICD-10-CM | POA: Diagnosis not present

## 2023-07-11 DIAGNOSIS — D631 Anemia in chronic kidney disease: Secondary | ICD-10-CM | POA: Diagnosis not present

## 2023-07-11 DIAGNOSIS — N2581 Secondary hyperparathyroidism of renal origin: Secondary | ICD-10-CM | POA: Diagnosis not present

## 2023-07-11 DIAGNOSIS — N1832 Chronic kidney disease, stage 3b: Secondary | ICD-10-CM | POA: Diagnosis not present

## 2023-07-11 DIAGNOSIS — E1122 Type 2 diabetes mellitus with diabetic chronic kidney disease: Secondary | ICD-10-CM | POA: Diagnosis not present

## 2023-07-20 DIAGNOSIS — H524 Presbyopia: Secondary | ICD-10-CM | POA: Diagnosis not present

## 2023-08-29 DIAGNOSIS — K219 Gastro-esophageal reflux disease without esophagitis: Secondary | ICD-10-CM | POA: Diagnosis not present

## 2023-08-29 DIAGNOSIS — R142 Eructation: Secondary | ICD-10-CM | POA: Diagnosis not present

## 2023-09-25 DIAGNOSIS — B351 Tinea unguium: Secondary | ICD-10-CM | POA: Diagnosis not present

## 2023-09-25 DIAGNOSIS — L851 Acquired keratosis [keratoderma] palmaris et plantaris: Secondary | ICD-10-CM | POA: Diagnosis not present

## 2023-09-25 DIAGNOSIS — Z794 Long term (current) use of insulin: Secondary | ICD-10-CM | POA: Diagnosis not present

## 2023-09-25 DIAGNOSIS — E1140 Type 2 diabetes mellitus with diabetic neuropathy, unspecified: Secondary | ICD-10-CM | POA: Diagnosis not present

## 2023-09-27 DIAGNOSIS — E66812 Obesity, class 2: Secondary | ICD-10-CM | POA: Diagnosis not present

## 2023-09-27 DIAGNOSIS — K219 Gastro-esophageal reflux disease without esophagitis: Secondary | ICD-10-CM | POA: Diagnosis not present

## 2023-09-27 DIAGNOSIS — Z794 Long term (current) use of insulin: Secondary | ICD-10-CM | POA: Diagnosis not present

## 2023-09-27 DIAGNOSIS — E785 Hyperlipidemia, unspecified: Secondary | ICD-10-CM | POA: Diagnosis not present

## 2023-09-27 DIAGNOSIS — I10 Essential (primary) hypertension: Secondary | ICD-10-CM | POA: Diagnosis not present

## 2023-09-27 DIAGNOSIS — E1169 Type 2 diabetes mellitus with other specified complication: Secondary | ICD-10-CM | POA: Diagnosis not present

## 2023-09-27 DIAGNOSIS — Z6837 Body mass index (BMI) 37.0-37.9, adult: Secondary | ICD-10-CM | POA: Diagnosis not present

## 2023-09-27 DIAGNOSIS — N182 Chronic kidney disease, stage 2 (mild): Secondary | ICD-10-CM | POA: Diagnosis not present

## 2023-09-28 DIAGNOSIS — E119 Type 2 diabetes mellitus without complications: Secondary | ICD-10-CM | POA: Diagnosis not present

## 2023-10-04 DIAGNOSIS — D631 Anemia in chronic kidney disease: Secondary | ICD-10-CM | POA: Diagnosis not present

## 2023-10-04 DIAGNOSIS — N189 Chronic kidney disease, unspecified: Secondary | ICD-10-CM | POA: Diagnosis not present

## 2023-10-04 DIAGNOSIS — I129 Hypertensive chronic kidney disease with stage 1 through stage 4 chronic kidney disease, or unspecified chronic kidney disease: Secondary | ICD-10-CM | POA: Diagnosis not present

## 2023-10-04 DIAGNOSIS — E785 Hyperlipidemia, unspecified: Secondary | ICD-10-CM | POA: Diagnosis not present

## 2023-10-04 DIAGNOSIS — E1122 Type 2 diabetes mellitus with diabetic chronic kidney disease: Secondary | ICD-10-CM | POA: Diagnosis not present

## 2023-10-04 DIAGNOSIS — R9431 Abnormal electrocardiogram [ECG] [EKG]: Secondary | ICD-10-CM | POA: Diagnosis not present

## 2023-10-04 DIAGNOSIS — E669 Obesity, unspecified: Secondary | ICD-10-CM | POA: Diagnosis not present

## 2023-10-04 DIAGNOSIS — Z794 Long term (current) use of insulin: Secondary | ICD-10-CM | POA: Diagnosis not present

## 2023-10-04 DIAGNOSIS — K219 Gastro-esophageal reflux disease without esophagitis: Secondary | ICD-10-CM | POA: Diagnosis not present

## 2023-10-05 ENCOUNTER — Other Ambulatory Visit: Payer: Self-pay | Admitting: Infectious Diseases

## 2023-10-05 DIAGNOSIS — E1169 Type 2 diabetes mellitus with other specified complication: Secondary | ICD-10-CM

## 2023-10-05 DIAGNOSIS — I10 Essential (primary) hypertension: Secondary | ICD-10-CM

## 2023-10-05 DIAGNOSIS — R9431 Abnormal electrocardiogram [ECG] [EKG]: Secondary | ICD-10-CM

## 2023-10-10 ENCOUNTER — Ambulatory Visit
Admission: RE | Admit: 2023-10-10 | Discharge: 2023-10-10 | Disposition: A | Discharge status: Home / Self Care | Source: Ambulatory Visit | Attending: Infectious Diseases | Admitting: Infectious Diseases

## 2023-10-10 DIAGNOSIS — R9431 Abnormal electrocardiogram [ECG] [EKG]: Secondary | ICD-10-CM | POA: Diagnosis not present

## 2023-10-10 DIAGNOSIS — I10 Essential (primary) hypertension: Secondary | ICD-10-CM | POA: Diagnosis not present

## 2023-10-10 DIAGNOSIS — Z794 Long term (current) use of insulin: Secondary | ICD-10-CM | POA: Diagnosis not present

## 2023-10-10 DIAGNOSIS — E1169 Type 2 diabetes mellitus with other specified complication: Secondary | ICD-10-CM | POA: Diagnosis not present

## 2023-10-10 DIAGNOSIS — E785 Hyperlipidemia, unspecified: Secondary | ICD-10-CM | POA: Diagnosis not present

## 2023-10-10 LAB — NM MYOCAR MULTI W/SPECT W/WALL MOTION / EF
Base ST Depression (mm): 0 mm
Estimated workload: 1
Exercise duration (min): 1 min
Exercise duration (sec): 0 s
LV dias vol: 89 mL (ref 62–150)
LV sys vol: 16 mL (ref 4.2–5.8)
MPHR: 140 {beats}/min
Nuc Stress EF: 82 %
Peak HR: 83 {beats}/min
Percent HR: 59 %
Rest HR: 56 {beats}/min
Rest Nuclear Isotope Dose: 10.7 mCi
SDS: 0
SRS: 1
SSS: 0
ST Depression (mm): 0 mm
Stress Nuclear Isotope Dose: 32.9 mCi
TID: 1.04

## 2023-10-10 MED ORDER — REGADENOSON 0.4 MG/5ML IV SOLN
0.4000 mg | Freq: Once | INTRAVENOUS | Status: AC
  Administered 2023-10-10: 0.4 mg via INTRAVENOUS

## 2023-10-10 MED ORDER — TECHNETIUM TC 99M TETROFOSMIN IV KIT
32.9200 | PACK | Freq: Once | INTRAVENOUS | Status: AC | PRN
  Administered 2023-10-10: 32.92 via INTRAVENOUS

## 2023-10-10 MED ORDER — TECHNETIUM TC 99M TETROFOSMIN IV KIT
10.7200 | PACK | Freq: Once | INTRAVENOUS | Status: AC | PRN
  Administered 2023-10-10: 10.72 via INTRAVENOUS

## 2023-11-13 DIAGNOSIS — N1832 Chronic kidney disease, stage 3b: Secondary | ICD-10-CM | POA: Diagnosis not present

## 2023-11-13 DIAGNOSIS — D631 Anemia in chronic kidney disease: Secondary | ICD-10-CM | POA: Diagnosis not present

## 2023-11-13 DIAGNOSIS — N2581 Secondary hyperparathyroidism of renal origin: Secondary | ICD-10-CM | POA: Diagnosis not present

## 2023-11-13 DIAGNOSIS — E1122 Type 2 diabetes mellitus with diabetic chronic kidney disease: Secondary | ICD-10-CM | POA: Diagnosis not present

## 2023-11-13 DIAGNOSIS — I10 Essential (primary) hypertension: Secondary | ICD-10-CM | POA: Diagnosis not present

## 2023-12-28 DIAGNOSIS — B351 Tinea unguium: Secondary | ICD-10-CM | POA: Diagnosis not present

## 2023-12-28 DIAGNOSIS — E1140 Type 2 diabetes mellitus with diabetic neuropathy, unspecified: Secondary | ICD-10-CM | POA: Diagnosis not present

## 2023-12-28 DIAGNOSIS — E119 Type 2 diabetes mellitus without complications: Secondary | ICD-10-CM | POA: Diagnosis not present

## 2023-12-28 DIAGNOSIS — Z794 Long term (current) use of insulin: Secondary | ICD-10-CM | POA: Diagnosis not present

## 2023-12-28 DIAGNOSIS — L851 Acquired keratosis [keratoderma] palmaris et plantaris: Secondary | ICD-10-CM | POA: Diagnosis not present

## 2024-01-02 DIAGNOSIS — I10 Essential (primary) hypertension: Secondary | ICD-10-CM | POA: Diagnosis not present

## 2024-01-02 DIAGNOSIS — N182 Chronic kidney disease, stage 2 (mild): Secondary | ICD-10-CM | POA: Diagnosis not present

## 2024-01-02 DIAGNOSIS — K219 Gastro-esophageal reflux disease without esophagitis: Secondary | ICD-10-CM | POA: Diagnosis not present

## 2024-01-02 DIAGNOSIS — E1169 Type 2 diabetes mellitus with other specified complication: Secondary | ICD-10-CM | POA: Diagnosis not present

## 2024-01-02 DIAGNOSIS — D649 Anemia, unspecified: Secondary | ICD-10-CM | POA: Diagnosis not present

## 2024-01-02 DIAGNOSIS — E785 Hyperlipidemia, unspecified: Secondary | ICD-10-CM | POA: Diagnosis not present

## 2024-01-02 DIAGNOSIS — Z794 Long term (current) use of insulin: Secondary | ICD-10-CM | POA: Diagnosis not present

## 2024-01-02 DIAGNOSIS — E66812 Obesity, class 2: Secondary | ICD-10-CM | POA: Diagnosis not present

## 2024-01-09 DIAGNOSIS — E119 Type 2 diabetes mellitus without complications: Secondary | ICD-10-CM | POA: Diagnosis not present

## 2024-01-09 DIAGNOSIS — Z6830 Body mass index (BMI) 30.0-30.9, adult: Secondary | ICD-10-CM | POA: Diagnosis not present

## 2024-01-09 DIAGNOSIS — N189 Chronic kidney disease, unspecified: Secondary | ICD-10-CM | POA: Diagnosis not present

## 2024-01-09 DIAGNOSIS — E785 Hyperlipidemia, unspecified: Secondary | ICD-10-CM | POA: Diagnosis not present

## 2024-01-09 DIAGNOSIS — K219 Gastro-esophageal reflux disease without esophagitis: Secondary | ICD-10-CM | POA: Diagnosis not present

## 2024-01-09 DIAGNOSIS — I10 Essential (primary) hypertension: Secondary | ICD-10-CM | POA: Diagnosis not present

## 2024-01-09 DIAGNOSIS — D631 Anemia in chronic kidney disease: Secondary | ICD-10-CM | POA: Diagnosis not present

## 2024-01-09 DIAGNOSIS — Z23 Encounter for immunization: Secondary | ICD-10-CM | POA: Diagnosis not present

## 2024-01-09 DIAGNOSIS — E669 Obesity, unspecified: Secondary | ICD-10-CM | POA: Diagnosis not present

## 2024-03-18 DIAGNOSIS — E1122 Type 2 diabetes mellitus with diabetic chronic kidney disease: Secondary | ICD-10-CM | POA: Diagnosis not present

## 2024-03-18 DIAGNOSIS — N1832 Chronic kidney disease, stage 3b: Secondary | ICD-10-CM | POA: Diagnosis not present

## 2024-03-18 DIAGNOSIS — D631 Anemia in chronic kidney disease: Secondary | ICD-10-CM | POA: Diagnosis not present

## 2024-03-18 DIAGNOSIS — Z7985 Long-term (current) use of injectable non-insulin antidiabetic drugs: Secondary | ICD-10-CM | POA: Diagnosis not present

## 2024-03-18 DIAGNOSIS — I10 Essential (primary) hypertension: Secondary | ICD-10-CM | POA: Diagnosis not present

## 2024-03-18 DIAGNOSIS — N2581 Secondary hyperparathyroidism of renal origin: Secondary | ICD-10-CM | POA: Diagnosis not present
